# Patient Record
Sex: Male | Born: 1945 | Race: White | Hispanic: No | Marital: Single | Smoking: Former smoker
Health system: Southern US, Community
[De-identification: ages and names within clinical notes are randomized; demographics above are authoritative.]

## PROBLEM LIST (undated history)

## (undated) DIAGNOSIS — I10 Essential (primary) hypertension: Secondary | ICD-10-CM

## (undated) HISTORY — PX: TONSILLECTOMY: SUR1361

---

## 2004-08-23 ENCOUNTER — Emergency Department (HOSPITAL_COMMUNITY): Admission: EM | Admit: 2004-08-23 | Discharge: 2004-08-23 | Payer: Self-pay | Admitting: *Deleted

## 2005-09-11 ENCOUNTER — Ambulatory Visit: Payer: Self-pay | Admitting: Gastroenterology

## 2005-10-14 ENCOUNTER — Ambulatory Visit: Payer: Self-pay | Admitting: Gastroenterology

## 2005-10-14 ENCOUNTER — Encounter (INDEPENDENT_AMBULATORY_CARE_PROVIDER_SITE_OTHER): Payer: Self-pay | Admitting: Specialist

## 2005-10-30 ENCOUNTER — Ambulatory Visit (HOSPITAL_BASED_OUTPATIENT_CLINIC_OR_DEPARTMENT_OTHER): Admission: RE | Admit: 2005-10-30 | Discharge: 2005-10-30 | Payer: Self-pay | Admitting: Emergency Medicine

## 2005-11-03 ENCOUNTER — Ambulatory Visit: Payer: Self-pay | Admitting: Internal Medicine

## 2007-08-17 ENCOUNTER — Encounter: Admission: RE | Admit: 2007-08-17 | Discharge: 2007-08-17 | Payer: Self-pay | Admitting: Internal Medicine

## 2008-07-29 ENCOUNTER — Encounter: Admission: RE | Admit: 2008-07-29 | Discharge: 2008-07-29 | Payer: Self-pay | Admitting: Internal Medicine

## 2010-09-26 ENCOUNTER — Encounter: Payer: Self-pay | Admitting: Gastroenterology

## 2010-10-18 NOTE — Letter (Signed)
Summary: Colonoscopy Letter  [facility]  [address] [place], [place] [address]   Phone: [phone]  Fax: [phone]      September 26, 2010 MRN: [id number]   Anthony Burns [address] [place], [place]  [address]   Dear Mr. RECORD,   According to your medical record, it is time for you to schedule a Colonoscopy. The American Cancer Society recommends this procedure as a method to detect early colon cancer. Patients with a family history of colon cancer, or a personal history of colon polyps or inflammatory bowel disease are at increased risk.  This letter has been generated based on the recommendations made at the time of your procedure. If you feel that in your particular situation this may no longer apply, please contact our office.  Please call our office at [phone] to schedule this appointment or to update your records at your earliest convenience.  Thank you for cooperating with [place] to provide you with the very best care possible.   Sincerely,  Judie Petit T. Russella Dar, M.D.  [facility] [phone]

## 2011-02-01 NOTE — Procedures (Signed)
Anthony Burns, Anthony Burns               ACCOUNT NO.:  [id number]   MEDICAL RECORD NO.:  [id number]          PATIENT TYPE:  OUT   LOCATION:  SLEEP CENTER                 FACILITY:  [facility]   PHYSICIAN:  Clinton D. Maple Hudson, M.D. DATE OF BIRTH:  [birth year]   DATE OF STUDY:                              NOCTURNAL POLYSOMNOGRAM   REFERRING PHYSICIAN:  Dr. Leslee Home.   DATE OF STUDY:  October 30, 2005.   INDICATION FOR STUDY:  Insomnia with sleep apnea.   EPWORTH SLEEPINESS SCORE:  7/24.   BMI:  24.   WEIGHT:  190 pounds.   HOME MEDICATION:  Adderall.   The patient describes a rotating work shift and high stress associated with  his work.   SLEEP ARCHITECTURE:  Total sleep time 324 minutes with sleep efficiency 80%.  Stage I was 2%, stage II 67%, stages III and IV absent, REM 31% of total  sleep time. Sleep latency 78 minutes, REM latency 54 minutes, awake after  sleep onset 6 minutes, arousal index 16.8, indicating mild fragmentation. No  bedtime medication was reported.   RESPIRATORY DATA:  Apnea/hypopnea index (AHI, RDI) 11.6 obstructive events  per hour indicating mild obstructive sleep apnea/hypopnea syndrome. This  included 1 central apnea, 5 obstructive apneas and 57 hypopneas. Events were  not positional. REM AHI 24.2 per hour. Criteria for C-PAP titration by split  protocol on the study night were not met.   OXYGEN DATA:  Moderate snoring with oxygen desaturation to a nadir of 87%.  Mean saturation through the study was 94% on room air.   CARDIAC DATA:  Normal sinus rhythm with occasional PAC.   MOVEMENT SLASH PARASOMNIA:  Occasional leg jerk with insignificant impact on  sleep.   IMPRESSION/RECOMMENDATIONS:  1.  Mild obstructive sleep apnea/hypopnea syndrome, AHI 11.6 per hour with      nonpositional events, moderate snoring and oxygen desaturation to 87%.  2.  Scores in this range are usually treated with C-PAP only if more      conservative measures are unsuccessful.  Note that the patient was fitted      for a mask at the beginning of the study and indicated that he would not      be comfortable wearing it and apparently felt that it would be too      confining of his need to shift position during sleep. If he were to      return for C-PAP titration, I would anticipate he might benefit from a      sleep medication and time to adjust and learn to work with C-PAP if it      were to be a successful therapeutic option.      Clinton D. Maple Hudson, M.D.  Diplomate, Biomedical engineer of Sleep Medicine  Electronically Signed     CDY/MEDQ  D:  11/03/2005 12:01:00  T:  11/03/2005 23:09:01  Job:  [id number]

## 2012-06-01 ENCOUNTER — Encounter: Payer: Self-pay | Admitting: Gastroenterology

## 2012-07-08 ENCOUNTER — Encounter: Payer: Self-pay | Admitting: Gastroenterology

## 2012-07-08 ENCOUNTER — Other Ambulatory Visit: Payer: Self-pay | Admitting: Internal Medicine

## 2012-07-08 DIAGNOSIS — R29890 Loss of height: Secondary | ICD-10-CM

## 2012-07-10 ENCOUNTER — Other Ambulatory Visit: Payer: Self-pay

## 2012-07-20 ENCOUNTER — Ambulatory Visit
Admission: RE | Admit: 2012-07-20 | Discharge: 2012-07-20 | Disposition: A | Discharge status: Home / Self Care | Payer: BC Managed Care – PPO | Source: Ambulatory Visit | Attending: Internal Medicine | Admitting: Internal Medicine

## 2012-07-20 DIAGNOSIS — R29890 Loss of height: Secondary | ICD-10-CM

## 2012-08-07 ENCOUNTER — Ambulatory Visit
Admission: RE | Admit: 2012-08-07 | Discharge: 2012-08-07 | Disposition: A | Discharge status: Home / Self Care | Payer: BC Managed Care – PPO | Source: Ambulatory Visit | Attending: Internal Medicine | Admitting: Internal Medicine

## 2012-08-07 ENCOUNTER — Other Ambulatory Visit: Payer: Self-pay | Admitting: Internal Medicine

## 2012-08-07 DIAGNOSIS — R05 Cough: Secondary | ICD-10-CM

## 2012-08-31 ENCOUNTER — Telehealth: Payer: Self-pay | Admitting: *Deleted

## 2012-08-31 NOTE — Telephone Encounter (Signed)
NO show for previsit. Sent no show letter

## 2012-09-14 ENCOUNTER — Encounter: Payer: Self-pay | Admitting: Gastroenterology

## 2012-09-17 ENCOUNTER — Telehealth: Payer: Self-pay

## 2012-09-21 NOTE — Telephone Encounter (Signed)
No answer

## 2012-10-02 ENCOUNTER — Encounter: Payer: Self-pay | Admitting: Gastroenterology

## 2012-11-13 ENCOUNTER — Telehealth: Payer: Self-pay | Admitting: Gastroenterology

## 2012-11-16 NOTE — Telephone Encounter (Signed)
No answer and no machine

## 2012-11-17 NOTE — Telephone Encounter (Signed)
I have attempted to reach the patient again.  There is no answer at the listed phone number and his work number is a number that is no longer in service.  I will await a return call from the patient

## 2012-12-30 ENCOUNTER — Encounter: Payer: Self-pay | Admitting: Gastroenterology

## 2014-02-03 ENCOUNTER — Ambulatory Visit (INDEPENDENT_AMBULATORY_CARE_PROVIDER_SITE_OTHER): Payer: BC Managed Care – PPO | Admitting: Family Medicine

## 2014-02-03 VITALS — BP 136/86 | HR 83 | Ht 74.0 in | Wt 199.0 lb

## 2014-02-03 DIAGNOSIS — G5700 Lesion of sciatic nerve, unspecified lower limb: Secondary | ICD-10-CM

## 2014-02-03 DIAGNOSIS — S76011A Strain of muscle, fascia and tendon of right hip, initial encounter: Secondary | ICD-10-CM

## 2014-02-03 DIAGNOSIS — IMO0002 Reserved for concepts with insufficient information to code with codable children: Secondary | ICD-10-CM

## 2014-02-03 DIAGNOSIS — G5701 Lesion of sciatic nerve, right lower limb: Secondary | ICD-10-CM

## 2014-02-03 NOTE — Assessment & Plan Note (Signed)
Currently the patient's pain is secondary to a flexor strain. Patient was given home exercise program, discussed anti-inflammatories and over-the-counter medications that could be beneficial as well as an icing program. Patient try these interventions and come back in 3 weeks. Patient has not made any improvement at length ultrasound area as well as get an x-ray patient's hip to make sure that this is not referred pain. Patient is also having piriformis tightness as well.

## 2014-02-03 NOTE — Patient Instructions (Signed)
Very nice to meet you.  Tennis ball in your back right pocket at all times.  Try the exercises most days of the week Ice 20 minutes 2 times a day Turmeric 500mg  twice daily. Heel lift in right shoe at all times.  Ask Dr. Alessandra BevelsVaughn about your iron level, if low or low normal then start 325mg  daily.  Come back in 3 weeks.

## 2014-02-03 NOTE — Progress Notes (Signed)
  Tawana ScaleZach Smith D.O. [facility] [address] [place], [place] [address] Phone: [phone] Subjective:      CC: Hip problem.   BJY:NWGNFAOZHYHPI:Subjective Pricilla LarssonStephen P Stegmaier is a 68 y.o. male coming in with complaint of Hip pain. Patient states that this is his right hip. Patient states it: On for quite a while. Patient contribute the original injury secondary to the same position at work. Patient states that now he has pain any time he ambulates. Patient states it seems to be on the anterior lateral aspect of the hip. Denies any groin pain that is associated with that. Patient has tried icing. Patient is to walk a considerable distance and states that he is unable to walk 100 yards without feeling fatigued. Patient denies any radiation down the leg or any numbness. Patient denies any significant weakness as well. Patient states though that the pain doesn't seem to wake him up at night either. Rates the pain 6/10 in severity.     Past medical history, social, surgical and family history all reviewed in electronic medical record.   Review of Systems: No headache, visual changes, nausea, vomiting, diarrhea, constipation, dizziness, abdominal pain, skin rash, fevers, chills, night sweats, weight loss, swollen lymph nodes, body aches, joint swelling, muscle aches, chest pain, shortness of breath, mood changes.   Objective Blood pressure 136/86, pulse 83, height 6\' 2"  (1.88 m), weight 199 lb (90.266 kg), SpO2 96.00%.  General: No apparent distress alert and oriented x3 mood and affect normal, dressed appropriately.  HEENT: Pupils equal, extraocular movements intact  Respiratory: Patient's speak in full sentences and does not appear short of breath  Cardiovascular: No lower extremity edema, non tender, no erythema  Skin: Warm dry intact with no signs of infection or rash on extremities or on axial skeleton.  Abdomen: Soft nontender  Neuro: Cranial nerves II through XII are intact, neurovascularly  intact in all extremities with 2+ DTRs and 2+ pulses.  Lymph: No lymphadenopathy of posterior or anterior cervical chain or axillae bilaterally.  Gait normal with good balance and coordination.  MSK:  Non tender with full range of motion and good stability and symmetric strength and tone of shoulders, elbows, wrist,  knee and ankles bilaterally.  Hip: ROM IR: 35 Deg, ER: 45 Deg, Flexion: 120 Deg, Extension: 90 Deg, Abduction: 45 Deg, Adduction: 45 Deg Strength IR: 5/5, ER: 5/5, Flexion: 4/5, Extension: 5/5, Abduction: 4/5, Adduction: 5/5 Pelvic alignment unremarkable to inspection and palpation. Standing hip rotation and gait without trendelenburg sign / unsteadiness.  Mild tenderness over piriformis and greater trochanter. No pain with FABER or FADIR. No SI joint tenderness and normal minimal SI movement.      Impression and Recommendations:     This case required medical decision making of moderate complexity.

## 2014-02-03 NOTE — Assessment & Plan Note (Signed)
Compensation secondary to patient's hip flexor strain I think. Piriformis Syndrome  Using an anatomical model, reviewed with the patient the structures involved and how they related to diagnosis. The patient indicated understanding.   The patient was given a handout from Dr. Ailene Ardsouzier's book "The Sports Medicine Patient Advisor" describing the anatomy and rehabilitation of the following condition: Piriformis Syndrome  Also given a handout with more extensive Piriformis stretching, hip flexor and abductor strengthening, ham stretching  Rec deep massage, explained self-massage with ball RTC in 3 weeks.

## 2014-03-03 ENCOUNTER — Other Ambulatory Visit (INDEPENDENT_AMBULATORY_CARE_PROVIDER_SITE_OTHER): Payer: BC Managed Care – PPO

## 2014-03-03 ENCOUNTER — Ambulatory Visit (INDEPENDENT_AMBULATORY_CARE_PROVIDER_SITE_OTHER): Payer: BC Managed Care – PPO | Admitting: Family Medicine

## 2014-03-03 VITALS — BP 140/88 | HR 70 | Ht 74.0 in | Wt 201.0 lb

## 2014-03-03 DIAGNOSIS — M7061 Trochanteric bursitis, right hip: Secondary | ICD-10-CM | POA: Insufficient documentation

## 2014-03-03 DIAGNOSIS — M25559 Pain in unspecified hip: Secondary | ICD-10-CM

## 2014-03-03 DIAGNOSIS — M76899 Other specified enthesopathies of unspecified lower limb, excluding foot: Secondary | ICD-10-CM

## 2014-03-03 DIAGNOSIS — M25551 Pain in right hip: Secondary | ICD-10-CM

## 2014-03-03 NOTE — Progress Notes (Signed)
Anthony ScaleZach Burns D.O. [facility] [address] [place], [place] [address] Phone: [phone](336) [phone] Subjective:      CC: Hip problem follow up.   BJY:NWGNFAOZHYHPI:Subjective Anthony LarssonStephen P Burns is a 68 y.o. male coming in with complaint of Hip pain. Patient was seen previously was diagnosed with appear former syndrome as well as hip flexor strain. Patient states that the hip flexors seems to be better patient states that most of his pain is now localized over the lateral aspect of the hip. Patient is to the piriformis stretches has been helpful and has been doing some deep massage which has been also helpful. Patient though states that now it seems to be mostly on the lateral aspect of the hip and can be very severe. Patient notes is going from a sitting to standing position being the worst. Patient still has been able to walk and able to work but finds it difficult secondary to this pain. Does not seem to be responding to our other modalities.     Past medical history, social, surgical and family history all reviewed in electronic medical record.   Review of Systems: No headache, visual changes, nausea, vomiting, diarrhea, constipation, dizziness, abdominal pain, skin rash, fevers, chills, night sweats, weight loss, swollen lymph nodes, body aches, joint swelling, muscle aches, chest pain, shortness of breath, mood changes.   Objective Blood pressure 140/88, pulse 70, height 6\' 2"  (1.88 m), weight 201 lb (91.173 kg), SpO2 96.00%.  General: No apparent distress alert and oriented x3 mood and affect normal, dressed appropriately.  HEENT: Pupils equal, extraocular movements intact  Respiratory: Patient's speak in full sentences and does not appear short of breath  Cardiovascular: No lower extremity edema, non tender, no erythema  Skin: Warm dry intact with no signs of infection or rash on extremities or on axial skeleton.  Abdomen: Soft nontender  Neuro: Cranial nerves II through XII are intact,  neurovascularly intact in all extremities with 2+ DTRs and 2+ pulses.  Lymph: No lymphadenopathy of posterior or anterior cervical chain or axillae bilaterally.  Gait normal with good balance and coordination.  MSK:  Non tender with full range of motion and good stability and symmetric strength and tone of shoulders, elbows, wrist,  knee and ankles bilaterally.  Hip: ROM IR: 35 Deg, ER: 45 Deg, Flexion: 120 Deg, Extension: 90 Deg, Abduction: 45 Deg, Adduction: 45 Deg Strength IR: 5/5, ER: 5/5, Flexion: 4/5, Extension: 5/5, Abduction: 4/5, Adduction: 5/5 Pelvic alignment unremarkable to inspection and palpation. Standing hip rotation and gait without trendelenburg sign / unsteadiness.  Severe tenderness over the greater trochanteric bursa No pain with FABER or FADIR. No SI joint tenderness and normal minimal SI movement.   Procedure: Real-time Ultrasound Guided Injection of right greater trochanteric bursitis secondary to patient's body habitus Device: GE Logiq E  Ultrasound guided injection is preferred based studies that show increased duration, increased effect, greater accuracy, decreased procedural pain, increased response rate, and decreased cost with ultrasound guided versus blind injection.  Verbal informed consent obtained.  Time-out conducted.  Noted no overlying erythema, induration, or other signs of local infection.  Skin prepped in a sterile fashion.  Local anesthesia: Topical Ethyl chloride.  With sterile technique and under real time ultrasound guidance:  Greater trochanteric area was visualized and patient's bursa was noted. A 22-gauge 3 inch needle was inserted and 4 cc of 0.5% Marcaine and 1 cc of Kenalog 40 mg/dL was injected. Pictures taken Completed without difficulty  Pain immediately resolved suggesting  accurate placement of the medication.  Advised to call if fevers/chills, erythema, induration, drainage, or persistent bleeding.  Images permanently stored and  available for review in the ultrasound unit.  Impression: Technically successful ultrasound guided injection.      Impression and Recommendations:     This case required medical decision making of moderate complexity.

## 2014-03-03 NOTE — Patient Instructions (Signed)
Good to see you One exercises on the wall put heel and butt on wall.  Raise 6-8 inches and hold 2 seconds, down slow for count of 4 seconds 30 reps daily first week, 2 sets daily second week, 3 sets daily thereafter Look at handout for stretches.  Come back in 3 weeks.

## 2014-03-03 NOTE — Assessment & Plan Note (Signed)
Patient had injection today with near complete resolution of pain. We discussed some exercises and was given a handout. Patient to try these interventions as well as continue the icing and the over-the-counter medications we suggested previously and come back and see us again in 3-4 weeks.  Spent greater than 25 minutes with patient face-to-face and had greater than 50% of counseling including as described above in assessment and plan.

## 2014-03-31 ENCOUNTER — Ambulatory Visit (INDEPENDENT_AMBULATORY_CARE_PROVIDER_SITE_OTHER)
Admission: RE | Admit: 2014-03-31 | Discharge: 2014-03-31 | Disposition: A | Discharge status: Home / Self Care | Payer: BC Managed Care – PPO | Source: Ambulatory Visit | Attending: Family Medicine | Admitting: Family Medicine

## 2014-03-31 ENCOUNTER — Ambulatory Visit (INDEPENDENT_AMBULATORY_CARE_PROVIDER_SITE_OTHER): Payer: BC Managed Care – PPO | Admitting: Family Medicine

## 2014-03-31 VITALS — BP 120/80 | HR 72 | Temp 97.5°F | Ht 74.0 in | Wt 203.0 lb

## 2014-03-31 DIAGNOSIS — M7061 Trochanteric bursitis, right hip: Secondary | ICD-10-CM

## 2014-03-31 DIAGNOSIS — M25559 Pain in unspecified hip: Secondary | ICD-10-CM

## 2014-03-31 DIAGNOSIS — M76899 Other specified enthesopathies of unspecified lower limb, excluding foot: Secondary | ICD-10-CM

## 2014-03-31 DIAGNOSIS — M25551 Pain in right hip: Secondary | ICD-10-CM

## 2014-03-31 MED ORDER — DICLOFENAC SODIUM 2 % TD SOLN: 2.0000 "application " | Freq: Two times a day (BID) | TRANSDERMAL | Status: DC

## 2014-03-31 NOTE — Progress Notes (Signed)
  Anthony ScaleZach Burns D.O. [facility] [address] [place], [place] [address] Phone: [phone] Subjective:      CC: Hip problem follow up.   BJY:NWGNFAOZHYHPI:Subjective Anthony LarssonStephen P Burns is a 68 y.o. male coming in with complaint of Hip pain. Patient does have a hip flexor strain as well as greater trochanteric bursitis. Patient did have an injection at last visit and states that since then he has made significant improvement in pain. Patient states he is about 50-60% better. Patient is able to do much more with ambulation. Still having trouble when he puts weight on one leg such as going up stairs. Patient has been walking with a crutch to be able to do this. Otherwise he has no pain. Patient states when going from a seated to standing position it does take some time for the relaxation occurs. Still denies any groin pain and states that all the pain seems to be mostly in the posterior and lateral aspects of the leg.     Past medical history, social, surgical and family history all reviewed in electronic medical record.   Review of Systems: No headache, visual changes, nausea, vomiting, diarrhea, constipation, dizziness, abdominal pain, skin rash, fevers, chills, night sweats, weight loss, swollen lymph nodes, body aches, joint swelling, muscle aches, chest pain, shortness of breath, mood changes.   Objective Blood pressure 120/80, pulse 72, temperature 97.5 F (36.4 C), temperature source Oral, height 6\' 2"  (1.88 m), weight 203 lb (92.08 kg), SpO2 95.00%.  General: No apparent distress alert and oriented x3 mood and affect normal, dressed appropriately.  HEENT: Pupils equal, extraocular movements intact  Respiratory: Patient's speak in full sentences and does not appear short of breath  Cardiovascular: No lower extremity edema, non tender, no erythema  Skin: Warm dry intact with no signs of infection or rash on extremities or on axial skeleton.  Abdomen: Soft nontender  Neuro: Cranial nerves II  through XII are intact, neurovascularly intact in all extremities with 2+ DTRs and 2+ pulses.  Lymph: No lymphadenopathy of posterior or anterior cervical chain or axillae bilaterally.  Gait normal with good balance and coordination.  MSK:  Non tender with full range of motion and good stability and symmetric strength and tone of shoulders, elbows, wrist,  knee and ankles bilaterally.  Hip: right ROM IR: 35 Deg, ER: 45 Deg, Flexion: 120 Deg, Extension: 90 Deg, Abduction: 45 Deg, Adduction: 45 Deg Strength IR: 5/5, ER: 5/5, Flexion: 4/5, Extension: 5/5, Abduction: 4/5, Adduction: 5/5 Pelvic alignment unremarkable to inspection and palpation. Standing hip rotation and gait without trendelenburg sign / unsteadiness.  Still minimally tender over the lateral aspect of the hip. No pain with FABER or FADIR. No SI joint tenderness and normal minimal SI movement.       Impression and Recommendations:     This case required medical decision making of moderate complexity.

## 2014-03-31 NOTE — Assessment & Plan Note (Signed)
Patient is making significant strides. We discussed the possibility of formal physical therapy which she declined. We discussed the possibility of topical anti-inflammatories and patient was given a trial size. Patient was also given a prescription. We discussed icing protocol and continuing home exercises. I think there is a concern than patient has more of a hip flexor strain. I would like to get x-rays to rule out any intra-articular pathology. Patient will follow up with me again in 3-4 weeks for further evaluation and treatment.  Spent greater than 25 minutes with patient face-to-face and had greater than 50% of counseling including as described above in assessment and plan.

## 2014-03-31 NOTE — Patient Instructions (Signed)
You are doing good.  Ice is still your friend.  Try exercises still 3 times a week.  Try the pennsaid 2 times daily.  Xrays downstairs and no news is good news Come back in 3 weeks.

## 2014-03-31 NOTE — Progress Notes (Signed)
Pre visit review using our clinic review tool, if applicable. No additional management support is needed unless otherwise documented below in the visit note. 

## 2014-04-28 ENCOUNTER — Ambulatory Visit: Payer: BC Managed Care – PPO | Admitting: Family Medicine

## 2014-05-02 ENCOUNTER — Encounter: Payer: Self-pay | Admitting: Family Medicine

## 2014-05-02 ENCOUNTER — Ambulatory Visit (INDEPENDENT_AMBULATORY_CARE_PROVIDER_SITE_OTHER): Payer: BC Managed Care – PPO | Admitting: Family Medicine

## 2014-05-02 ENCOUNTER — Ambulatory Visit (INDEPENDENT_AMBULATORY_CARE_PROVIDER_SITE_OTHER)
Admission: RE | Admit: 2014-05-02 | Discharge: 2014-05-02 | Disposition: A | Discharge status: Home / Self Care | Payer: BC Managed Care – PPO | Source: Ambulatory Visit | Attending: Family Medicine | Admitting: Family Medicine

## 2014-05-02 ENCOUNTER — Encounter: Payer: Self-pay | Admitting: *Deleted

## 2014-05-02 VITALS — BP 142/80 | HR 64 | Ht 74.0 in | Wt 204.0 lb

## 2014-05-02 DIAGNOSIS — M999 Biomechanical lesion, unspecified: Secondary | ICD-10-CM

## 2014-05-02 DIAGNOSIS — G5701 Lesion of sciatic nerve, right lower limb: Secondary | ICD-10-CM

## 2014-05-02 DIAGNOSIS — M9981 Other biomechanical lesions of cervical region: Secondary | ICD-10-CM

## 2014-05-02 DIAGNOSIS — M542 Cervicalgia: Secondary | ICD-10-CM

## 2014-05-02 DIAGNOSIS — G5700 Lesion of sciatic nerve, unspecified lower limb: Secondary | ICD-10-CM

## 2014-05-02 NOTE — Assessment & Plan Note (Signed)
Patient overall does have some mild neck discomfort but does respond fairly well to osteopathic manipulation. We discussed the proper working position keeping his head in a neutral position that could be helpful. Home exercises given 3 times a week I think will be good. We discussed also postural exercises that I think would be beneficial. Patient will try these in her actions as well as continuing the over-the-counter medications he was taking previously and followup again in 3-4 weeks for further evaluation and treatment.  Spent greater than 25 minutes with patient face-to-face and had greater than 50% of counseling including as described above in assessment and plan.

## 2014-05-02 NOTE — Progress Notes (Signed)
Tawana ScaleZach Draco Burns D.O. [facility] [address] [place], [place] [address] Phone: [phone](336) [phone] Subjective:      CC: Hip problem follow up. New pain neck  MWU:XLKGMWNUUVHPI:Subjective Anthony LarssonStephen P Durante is a 68 y.o. male coming in with complaint of Hip pain. Patient does have a hip flexor strain as well as greater trochanteric bursitis. Patient states that approximately 2 weeks ago he woke up one morning the pain was completely gone. Patient states that he's been able to do different activities and has been feeling better. Patient continues to do the exercises as well as stretching as well as icing when needed. Patient is taking albeit over-the-counter medications was discussed previously. Patient is very happy with the results.  Patient was unfortunately having a new problem. Patient has been seen before for some neck discomfort but states that it seems to be worsening. Patient claims the problem on his working position which has changed recently. Patient states that he has to look down on his computer screen and this causes discomfort at the end of the day. Patient is concerned he may have some arthritis that has been exacerbated. Denies any radiation down the arms or any numbness but significant stiffness. Patient has responded well to osteopathic manipulation previously. Patient describes it is more of a dull aching sensation that can keep him up at night. Not responding to over-the-counter medicines really well.    Past medical history, social, surgical and family history all reviewed in electronic medical record.   Review of Systems: No headache, visual changes, nausea, vomiting, diarrhea, constipation, dizziness, abdominal pain, skin rash, fevers, chills, night sweats, weight loss, swollen lymph nodes, body aches, joint swelling, muscle aches, chest pain, shortness of breath, mood changes.   Objective Blood pressure 142/80, pulse 64, height 6\' 2"  (1.88 m), weight 204 lb (92.534 kg), SpO2 95.00%.    General: No apparent distress alert and oriented x3 mood and affect normal, dressed appropriately.  HEENT: Pupils equal, extraocular movements intact  Respiratory: Patient's speak in full sentences and does not appear short of breath  Cardiovascular: No lower extremity edema, non tender, no erythema  Skin: Warm dry intact with no signs of infection or rash on extremities or on axial skeleton.  Abdomen: Soft nontender  Neuro: Cranial nerves II through XII are intact, neurovascularly intact in all extremities with 2+ DTRs and 2+ pulses.  Lymph: No lymphadenopathy of posterior or anterior cervical chain or axillae bilaterally.  Gait normal with good balance and coordination.  MSK:  Non tender with full range of motion and good stability and symmetric strength and tone of shoulders, elbows, wrist,  knee and ankles bilaterally.  Hip: right ROM IR: 35 Deg, ER: 45 Deg, Flexion: 120 Deg, Extension: 90 Deg, Abduction: 45 Deg, Adduction: 45 Deg Strength IR: 5/5, ER: 5/5, Flexion: 4/5, Extension: 5/5, Abduction: 5/5, Adduction: 5/5 Pelvic alignment unremarkable to inspection and palpation. Standing hip rotation and gait without trendelenburg sign / unsteadiness. Nontender on the lateral aspect of the No pain with FABER or FADIR. No SI joint tenderness and normal minimal SI movement.  Neck: Inspection unremarkable. No palpable stepoffs. Negative Spurling's maneuver. Mild decrease the last 10 of extension as well as 10 of right-sided rotation Grip strength and sensation normal in bilateral hands Strength good C4 to T1 distribution No sensory change to C4 to T1 Negative Hoffman sign bilaterally Reflexes normal   OMT Physical Exam   Standing flexion right  Seated Flexion right  Cervical  C3 flexed rotated and  side bent right C7 flexed rotated and side bent left  Thoracic T3 extended rotated and side bent right  T5 extended rotated and side bent left  Lumbar L2 flexed rotated and  side bent right  Sacrum Left on left Illium        Impression and Recommendations:     This case required medical decision making of moderate complexity.

## 2014-05-02 NOTE — Assessment & Plan Note (Signed)
Decision today to treat with OMT was based on Physical Exam  After verbal consent patient was treated with muscle energy, FPR techniques in cervical, thoracic and lumbar areas  Patient tolerated the procedure well with improvement in symptoms  Patient given exercises, stretches and lifestyle modifications  See medications in patient instructions if given  Patient will follow up in 3-4 weeks

## 2014-05-02 NOTE — Patient Instructions (Signed)
Good to see you as always.  Hope you got your shopping done that day :) Ice is your friend For your neck try the exercises 3 times a week Exercises on wall.  Heels, butt shoulders and head touching goal of 5 minutes daily Monitor at eye level and keyboard should be at 90 degrees.  Vitamin D 2000 IU dialy Turmeric 500mg  twice daily  Glucosamine 1500mg  daily Come back in 4 weeks.

## 2014-05-02 NOTE — Assessment & Plan Note (Signed)
Patient seems to have had pain resolved at this time. Encourage him to continue the exercises on a regular basis. Patient will continue to work on hip abductor strengthening. Patient will come back and see me again in 3-4 weeks for further evaluation and treatment.

## 2015-05-01 ENCOUNTER — Encounter: Payer: Self-pay | Admitting: Family Medicine

## 2015-05-01 ENCOUNTER — Other Ambulatory Visit (INDEPENDENT_AMBULATORY_CARE_PROVIDER_SITE_OTHER): Payer: BC Managed Care – PPO

## 2015-05-01 ENCOUNTER — Ambulatory Visit (INDEPENDENT_AMBULATORY_CARE_PROVIDER_SITE_OTHER): Payer: BC Managed Care – PPO | Admitting: Family Medicine

## 2015-05-01 VITALS — BP 130/80 | HR 64 | Wt 211.0 lb

## 2015-05-01 DIAGNOSIS — M25562 Pain in left knee: Secondary | ICD-10-CM

## 2015-05-01 DIAGNOSIS — S83419A Sprain of medial collateral ligament of unspecified knee, initial encounter: Secondary | ICD-10-CM | POA: Insufficient documentation

## 2015-05-01 DIAGNOSIS — S83412A Sprain of medial collateral ligament of left knee, initial encounter: Secondary | ICD-10-CM | POA: Diagnosis not present

## 2015-05-01 NOTE — Progress Notes (Signed)
Pre visit review using our clinic review tool, if applicable. No additional management support is needed unless otherwise documented below in the visit note. 

## 2015-05-01 NOTE — Progress Notes (Signed)
[name] [address] [place], [place] [address] Phone: [phone] Subjective:    CC: Left knee pain  Anthony Burns is a 69 y.o. male coming in with complaint of left knee pain. Patient states that this left knee pain has been there intermittently for years but has significantly gotten worse over the course last 4-6 weeks. Patient describes the pain as more of a dull throbbing aching sensation on the medial aspect. Patient states that there is some instability and the mornings. Patient though states that throughout the day it seems to loosen up and feel little bit better. Patient states though that it can wake him up at night. Rates the severity of 5 out of 10. Has only been using some topical over-the-counter medications when it does hurt and it does respond to this. Denies any radiation down the leg, any locking or any numbness.     Past medical history, social, surgical and family history all reviewed in electronic medical record.   Review of Systems: No headache, visual changes, nausea, vomiting, diarrhea, constipation, dizziness, abdominal pain, skin rash, fevers, chills, night sweats, weight loss, swollen lymph nodes, body aches, joint swelling, muscle aches, chest pain, shortness of breath, mood changes.   Objective Blood pressure 130/80, pulse 64, weight 211 lb (95.709 kg), SpO2 96 %.  General: No apparent distress alert and oriented x3 mood and affect normal, dressed appropriately.  HEENT: Pupils equal, extraocular movements intact  Respiratory: Patient's speak in full sentences and does not appear short of breath  Cardiovascular: No lower extremity edema, non tender, no erythema  Skin: Warm dry intact with no signs of infection or rash on extremities or on axial skeleton.  Abdomen: Soft nontender  Neuro: Cranial nerves II through XII are intact, neurovascularly intact in all extremities with 2+ DTRs and 2+ pulses.  Lymph: No  lymphadenopathy of posterior or anterior cervical chain or axillae bilaterally.  Gait normal with good balance and coordination.  MSK:  Non tender with full range of motion and good stability and symmetric strength and tone of shoulders, elbows, wrist, hip and ankles bilaterally.  Knee: Left Normal to inspection with no erythema or effusion or obvious bony abnormalities. Tender to palpation over the medial joint line ROM full in flexion and extension and lower leg rotation. Ligaments with solid consistent endpoints including ACL, PCL, LCL, MCL. Discomfort with stressing the MCL Negative Mcmurray's, Apley's, and Thessalonian tests. Non painful patellar compression. Patellar glide without crepitus. Patellar and quadriceps tendons unremarkable. Hamstring and quadriceps strength is normal.    MSK US performed of: Left knee This study was ordered, performed, and interpreted by Terrilee Files D.O.  Knee: All structures visualized. Anteromedial, anterolateral, posteromedial, and posterolateral menisci unremarkable without tearing, fraying, effusion, or displacement. Minimal osteophytic changes noted. Patellar Tendon unremarkable on long and transverse views without effusion. No abnormality of prepatellar bursa. Patient does have significant hypoechoic changes at the insertion of the MCL on the tibia. No bony abnormality or avulsion noted. Increasing Doppler flow noted.  IMPRESSION:  MCL sprain  Procedure note 97110; 15 minutes spent for Therapeutic exercises as stated in above notes.  This included exercises focusing on stretching, strengthening, with significant focus on eccentric aspects.  Flexion and extension focusing on the vastus medialis oblique muscle as well as hip abductor strengthening. Handout given. Proper technique shown and discussed handout in great detail with ATC.  All questions were discussed and answered.  Impression and Recommendations:     This case required  medical decision making of moderate complexity.

## 2015-05-01 NOTE — Patient Instructions (Signed)
Good to see you and congrats on retirement.  Ice 20 minutes 2 times daily. Usually after activity and before bed. Exercises 3 times a week.  Tyr brace with activity pennsaid pinkie amount topically 2 times daily as needed.  Turmeric  twice daily See me again in 3-4 weeks.

## 2015-05-01 NOTE — Assessment & Plan Note (Signed)
Patient's exam is very nonspecific today. Patient on ultrasound only had some mild hypoechoic changes surrounding the MCL. Patient is tender in this area. Patient will be treated for an MCL sprain. Work with Event organiser to learn home exercises and icing protocol. Patient try topical anti-inflammatory signs needed. We discussed what activities to avoid as well as possible bracing. Patient come back and see me again in 3-4 weeks. If continuing have pain I would consider injection as well as x-rays. We will discuss at follow-up.

## 2015-05-26 ENCOUNTER — Other Ambulatory Visit (INDEPENDENT_AMBULATORY_CARE_PROVIDER_SITE_OTHER): Payer: BC Managed Care – PPO

## 2015-05-26 ENCOUNTER — Encounter: Payer: Self-pay | Admitting: Family Medicine

## 2015-05-26 ENCOUNTER — Ambulatory Visit (INDEPENDENT_AMBULATORY_CARE_PROVIDER_SITE_OTHER): Payer: BC Managed Care – PPO | Admitting: Family Medicine

## 2015-05-26 VITALS — BP 142/80 | HR 60 | Wt 209.0 lb

## 2015-05-26 DIAGNOSIS — S83412A Sprain of medial collateral ligament of left knee, initial encounter: Secondary | ICD-10-CM | POA: Diagnosis not present

## 2015-05-26 DIAGNOSIS — M25562 Pain in left knee: Secondary | ICD-10-CM

## 2015-05-26 NOTE — Progress Notes (Signed)
  [name] [address] [place], [place] [address] Phone: [phone] Subjective:    CC: Left knee pain  BJY:NWGNFAOZHY Anthony Burns is a 69 y.o. male coming in with complaint of left knee pain. Patient was found to have an MCL strain with question will meniscal injury. Patient's elected try conservative therapy with topical anti-implant was, icing, and bracing. Patient states that he is feeling significantly better. States that he is a proximal wing 90% better. States that if he walks a long amount of time he has some difficulty but the brace seems to be very helpful. Patient denies any locking on him or giving out on him. States that the instability that he is feeling previously is significantly better as well. No new symptoms.     Past medical history, social, surgical and family history all reviewed in electronic medical record.   Review of Systems: No headache, visual changes, nausea, vomiting, diarrhea, constipation, dizziness, abdominal pain, skin rash, fevers, chills, night sweats, weight loss, swollen lymph nodes, body aches, joint swelling, muscle aches, chest pain, shortness of breath, mood changes.   Objective Blood pressure 142/80, pulse 60, weight 209 lb (94.802 kg), SpO2 97 %.  General: No apparent distress alert and oriented x3 mood and affect normal, dressed appropriately.  HEENT: Pupils equal, extraocular movements intact  Respiratory: Patient's speak in full sentences and does not appear short of breath  Cardiovascular: No lower extremity edema, non tender, no erythema  Skin: Warm dry intact with no signs of infection or rash on extremities or on axial skeleton.  Abdomen: Soft nontender  Neuro: Cranial nerves II through XII are intact, neurovascularly intact in all extremities with 2+ DTRs and 2+ pulses.  Lymph: No lymphadenopathy of posterior or anterior cervical chain or axillae bilaterally.  Gait normal with good balance and  coordination.  MSK:  Non tender with full range of motion and good stability and symmetric strength and tone of shoulders, elbows, wrist, hip and ankles bilaterally.  Knee: Left Normal to inspection with no erythema or effusion or obvious bony abnormalities. Tender to palpation over the medial joint line ROM full in flexion and extension and lower leg rotation. Ligaments with solid consistent endpoints including ACL, PCL, LCL, MCL. No discomfort when stressing the MCL Negative Mcmurray's, Apley's, and Thessalonian tests. Non painful patellar compression. Patellar glide without crepitus. Patellar and quadriceps tendons unremarkable. Hamstring and quadriceps strength is normal.    MSK US performed of: Left knee This study was ordered, performed, and interpreted by Terrilee Files D.O.  Knee: All structures visualized. Anteromedial, anterolateral, posteromedial, and posterolateral menisci unremarkable without tearing, fraying, effusion, very mild subluxation. Minimal osteophytic changes noted. Patellar Tendon unremarkable on long and transverse views without effusion. No abnormality of prepatellar bursa. MCL seems to be completely resolved with very minimal hypoechoic changes on the articular side proximally.  IMPRESSION:  MCL sprain well-healed        Impression and Recommendations:     This case required medical decision making of moderate complexity.

## 2015-05-26 NOTE — Patient Instructions (Signed)
You are doing everything right Ice still after activity Wear brace less and less with daily activities but still good for long walks.  Increase activity as tolerated.  Should be pain free in 4-6 weeks if not see me again.

## 2015-05-26 NOTE — Assessment & Plan Note (Signed)
Patient is doing much better at this time. There is still a concern for potential meniscal injury. We discussed the possibility of an intra-articular injection which patient declined. Do not feel that further imaging is necessary with patient not having any instability at this time. Would like to see patient though improve his endurance with walking and exercises. Encourage him to do more strengthening. Patient will come back again in 6 weeks if pain is not completely resolved.  Spent  25 minutes with patient face-to-face and had greater than 50% of counseling including as described above in assessment and plan.

## 2016-07-24 ENCOUNTER — Other Ambulatory Visit: Payer: Self-pay | Admitting: Internal Medicine

## 2016-07-24 ENCOUNTER — Ambulatory Visit
Admission: RE | Admit: 2016-07-24 | Discharge: 2016-07-24 | Disposition: A | Discharge status: Home / Self Care | Payer: BC Managed Care – PPO | Source: Ambulatory Visit | Attending: Internal Medicine | Admitting: Internal Medicine

## 2016-07-24 DIAGNOSIS — M25562 Pain in left knee: Principal | ICD-10-CM

## 2016-07-24 DIAGNOSIS — M25561 Pain in right knee: Secondary | ICD-10-CM

## 2016-08-18 NOTE — Progress Notes (Signed)
[facility] Gianfranco Araki D.O[facility] [address] [place], [place] [address] Phone: [phone] Subjective:    CC: Left knee pain Follow-up  QMV:[id number]:Subjective  Anthony Burns is a 70 y.o. male coming in with complaint of left knee pain. Patient was last seen greater than one year ago. Patient was then that time have an MCL sprain as well as potential meniscal injury. Patient states Pain now seems to be bilateral. Worse with certain activities. Patient states that going up and down stairs or if he tries to kneel when he starts having increasing pain. Has noticed with the right knee has had started some mild limitation in range of motion. States that the pain can be on the front in the back of the knee. Does not remember any true injury.   patient did have x-rays done. Patient did have x-rays taken 07/24/2016 no independent living visualized by me showing slight narrowing of the medial joint space bilaterally with some calcific changes of the patellar tendon. Small effusions bilaterally.  No past medical history on file.  Patient Active Problem List   Diagnosis Date Noted  . Knee MCL sprain 05/01/2015  . Neck pain 05/02/2014  . Nonallopathic lesion of cervical region 05/02/2014  . Greater trochanteric bursitis of right hip 03/03/2014  . Strain of flexor muscle of right hip 02/03/2014  . Piriformis syndrome of right side 02/03/2014    No past surgical history on file. Social History   Social History  . Marital status: Single    Spouse name: N/A  . Number of children: N/A  . Years of education: N/A   Social History Main Topics  . Smoking status: Former Games developermoker  . Smokeless tobacco: None  . Alcohol use None  . Drug use: Unknown  . Sexual activity: Not Asked   Other Topics Concern  . None   Social History Narrative  . None   Not on File No family history on file. No family history rheumatological diseases.  Past medical history, social, surgical and family history all  reviewed in electronic medical record.  No pertanent information unless stated regarding to the chief complaint.   Review of Systems:Review of systems updated and as accurate as of 08/19/16  No headache, visual changes, nausea, vomiting, diarrhea, constipation, dizziness, abdominal pain, skin rash, fevers, chills, night sweats, weight loss, swollen lymph nodes, body aches, joint swelling, muscle aches, chest pain, shortness of breath, mood changes.   Objective  Blood pressure 138/80, pulse 69, height 6\' 2"  (1.88 m), weight 216 lb (98 kg), SpO2 95 %. Systems examined below as of 08/19/16   General: No apparent distress alert and oriented x3 mood and affect normal, dressed appropriately.  HEENT: Pupils equal, extraocular movements intact  Respiratory: Patient's speak in full sentences and does not appear short of breath  Cardiovascular: No lower extremity edema, non tender, no erythema  Skin: Warm dry intact with no signs of infection or rash on extremities or on axial skeleton.  Abdomen: Soft nontender  Neuro: Cranial nerves II through XII are intact, neurovascularly intact in all extremities with 2+ DTRs and 2+ pulses.  Lymph: No lymphadenopathy of posterior or anterior cervical chain or axillae bilaterally.  Gait normal with good balance and coordination.  MSK:  Non tender with full range of motion and good stability and symmetric strength and tone of shoulders, elbows, wrist, hip, and ankles bilaterally. Mild arthritic changes of multiple joints   Knee: Bilateral Normal to inspection with no erythema or effusion  or obvious bony abnormalities. Mild tenderness over the patella area bilaterally right greater left Lacks last 5 of flexion bilaterally Ligaments with solid consistent endpoints including ACL, PCL, LCL, MCL. Negative Mcmurray's, Apley's, and Thessalonian tests. painful patellar compression. Patellar glide with mild crepitus. Right greater left Patellar and quadriceps tendons  unremarkable. Hamstring and quadriceps strength is normal.   MSK US performed of: Bilateral This study was ordered, performed, and interpreted by Terrilee FilesZach Eli Pattillo D.O.  Knee: All structures visualized. Anteromedial, anterolateral, posteromedial, and posterolateral menisci unremarkable without tearing, fraying, effusion, or displacement. Mild narrowing noted of the medial joint line bilaterally Patellar Tendon unremarkable on long and transverse views without effusion. Calcific changes of the quadricep tendon noted. No abnormality of prepatellar bursa. LCL and MCL unremarkable on long and transverse views. No abnormality of origin of medial or lateral head of the gastrocnemius.  IMPRESSION:  Calcific quadricep tendinitis    Impression and Recommendations:     This case required medical decision making of moderate complexity.      Note: This dictation was prepared with Dragon dictation along with smaller phrase technology. Any transcriptional errors that result from this process are unintentional.

## 2016-08-19 ENCOUNTER — Encounter: Payer: Self-pay | Admitting: Family Medicine

## 2016-08-19 ENCOUNTER — Ambulatory Visit (INDEPENDENT_AMBULATORY_CARE_PROVIDER_SITE_OTHER): Payer: BC Managed Care – PPO | Admitting: Family Medicine

## 2016-08-19 DIAGNOSIS — M76899 Other specified enthesopathies of unspecified lower limb, excluding foot: Secondary | ICD-10-CM | POA: Insufficient documentation

## 2016-08-19 DIAGNOSIS — M170 Bilateral primary osteoarthritis of knee: Secondary | ICD-10-CM | POA: Diagnosis not present

## 2016-08-19 NOTE — Assessment & Plan Note (Signed)
Patient is more of a quadricep tendinitis. Work with Event organiserathletic trainer today to learn home exercises in greater detail. We discussed the importance of vitamin D with patient having calcific deposits. Discussed which x-rays to avoid, compression sleeves, do not believe that he needs a custom brace at this time. Some of the underlying arthritic changes could also be contribute in. Patient will come back and see me again in 4 weeks. If worsening pain then consider injection.

## 2016-08-19 NOTE — Patient Instructions (Addendum)
Good to see you  I do believe most of this is from quadricep tendonitis. Calcium is there as well.  Lets try a patella strap but wear it above the knee cap with activity  Ice 20 minutes 2 times daily. Usually after activity and before bed. Melxoicam daily for 10 days then as needed.  Avoid deep squats if you can  Biking or elliptical may be better for activity  Exercises 3 times a week.  See me again I n4-6 weeks if not better

## 2016-09-14 NOTE — Progress Notes (Signed)
Anthony Burns D.O. [facility] [address] [place], [place] [address] Phone: [phone] Subjective:    CC: Left knee pain Follow-up  BJY:NWGNFAOZHYHPI:Subjective  Anthony Burns is a 70 y.o. male coming in with complaint of left knee pain. Patient was seen one month ago and did have more of a quadricep tendinitis. Patient was having some tightness in the back of the knee that was likely secondary to hamstring tightness. Patient was to start doing some exercises. Patient states any has not had any significant improvement and continues to have swelling of the knee. Patient states though that the right side seems to be worse than left. Patient states on the [place] is being on the anterior aspect of the knee. Seems to be very localized on the right knee at this time.   patient did have x-rays done. Patient did have x-rays taken 07/24/2016 no independent living visualized by me showing slight narrowing of the medial joint space bilaterally with some calcific changes of the patellar tendon. Small effusions bilaterally.  No past medical history on file.  Patient Active Problem List   Diagnosis Date Noted  . Quadriceps tendinitis 08/19/2016  . Degenerative arthritis of knee, bilateral 08/19/2016  . Knee MCL sprain 05/01/2015  . Neck pain 05/02/2014  . Nonallopathic lesion of cervical region 05/02/2014  . Greater trochanteric bursitis of right hip 03/03/2014  . Strain of flexor muscle of right hip 02/03/2014  . Piriformis syndrome of right side 02/03/2014    No past surgical history on file. Social History   Social History  . Marital status: Single    Spouse name: N/A  . Number of children: N/A  . Years of education: N/A   Social History Main Topics  . Smoking status: Former Games developermoker  . Smokeless tobacco: None  . Alcohol use None  . Drug use: Unknown  . Sexual activity: Not Asked   Other Topics Concern  . None   Social History Narrative  . None   Not on File No family history on  file. No family history rheumatological diseases.  Past medical history, social, surgical and family history all reviewed in electronic medical record.  No pertanent information unless stated regarding to the chief complaint.   Review of Systems: No headache, visual changes, nausea, vomiting, diarrhea, constipation, dizziness, abdominal pain, skin rash, fevers, chills, night sweats, weight loss, swollen lymph nodes, body aches, joint swelling, muscle aches, chest pain, shortness of breath, mood changes.     Objective  Blood pressure 140/80, pulse 74, height 6\' 2"  (1.88 m), weight 221 lb (100.2 kg), SpO2 98 %.   Systems examined below as of 09/17/16 General: NAD A&O x3 mood, affect normal  HEENT: Pupils equal, extraocular movements intact no nystagmus Respiratory: not short of breath at rest or with speaking Cardiovascular: No lower extremity edema, non tender Skin: Warm dry intact with no signs of infection or rash on extremities or on axial skeleton. Abdomen: Soft nontender, no masses Neuro: Cranial nerves  intact, neurovascularly intact in all extremities with 2+ DTRs and 2+ pulses. Lymph: No lymphadenopathy appreciated today  Gait normal with good balance and coordination.  MSK:  Non tender with full range of motion and good stability and symmetric strength and tone of shoulders, elbows, wrist, hip, and ankles bilaterally. Mild arthritic changes of multiple joints   Knee: Bilateral Normal to inspection with no erythema or effusion or obvious bony abnormalities. Nontender on exam Lacks last 5 of flexion right side only right-sided Ligaments  with solid consistent endpoints including ACL, PCL, LCL, MCL. Negative Mcmurray's, Apley's, and Thessalonian tests. painful patellar compression. Patellar glide with mild crepitus. Right greater left Patellar and quadriceps tendons unremarkable. Hamstring and quadriceps strength is normal.   MSK US performed of: Bilateral This study was  ordered, performed, and interpreted by Anthony Burns D.O.  Knee: Patient's knee cyst noted under the VMO.  Compressible.   IMPRESSION:  Right infra-quadricep cyst  Procedure: Real-time Ultrasound Guided Injection of right knee Device: GE Logiq E  Ultrasound guided injection is preferred based studies that show increased duration, increased effect, greater accuracy, decreased procedural pain, increased response rate, and decreased cost with ultrasound guided versus blind injection.  Verbal informed consent obtained.  Time-out conducted.  Noted no overlying erythema, induration, or other signs of local infection.  Skin prepped in a sterile fashion.  Local anesthesia: Topical Ethyl chloride.  With sterile technique and under real time ultrasound guidance: with a 21g 2 inch needle injected with 2 cc of 0.5% marcaine. Then aspirated 1 mL gel-like fluid.then injected 0.5 cc of 40mg /dL kenalog.  Pain immediately resolved suggesting accurate placement of the medication.  Advised to call if fevers/chills, erythema, induration, drainage, or persistent bleeding.  Images permanently stored and available for review in the ultrasound unit.  Impression: Technically successful ultrasound guided injection.     Impression and Recommendations:     This case required medical decision making of moderate complexity.      Note: This dictation was prepared with Dragon dictation along with smaller phrase technology. Any transcriptional errors that result from this process are unintentional.

## 2016-09-17 ENCOUNTER — Encounter: Payer: Self-pay | Admitting: Family Medicine

## 2016-09-17 ENCOUNTER — Ambulatory Visit: Payer: Self-pay

## 2016-09-17 ENCOUNTER — Ambulatory Visit (INDEPENDENT_AMBULATORY_CARE_PROVIDER_SITE_OTHER): Payer: BC Managed Care – PPO | Admitting: Family Medicine

## 2016-09-17 VITALS — BP 140/80 | HR 74 | Ht 74.0 in | Wt 221.0 lb

## 2016-09-17 DIAGNOSIS — M76899 Other specified enthesopathies of unspecified lower limb, excluding foot: Secondary | ICD-10-CM | POA: Diagnosis not present

## 2016-09-17 DIAGNOSIS — M238X1 Other internal derangements of right knee: Secondary | ICD-10-CM | POA: Diagnosis not present

## 2016-09-17 NOTE — Patient Instructions (Addendum)
Happy New Year! Stay active We drained the cyst today and I hope it helps Otherwise the knee looks good.   OK for you to start some more walking if you would like.  I would continue my exercises 2-3 times a week and then would consider going to the gym 3-4 times a week.

## 2016-09-17 NOTE — Assessment & Plan Note (Signed)
Patient did have aspiration today. Tolerated the procedure well. We discussed icing regimen. Patient will watch range of motion. Does have some mild quadricep tendinitis still remaining. We'll increase activity slowly. Patient will follow-up again of worsening symptoms. Full range of motion at the end.

## 2017-01-21 NOTE — Progress Notes (Signed)
[name] [address] [place], [place] [address] Phone: [phone] Subjective:    CC: right knee pain Follow-up  [id number]  Anthony Burns is a 71 y.o. male coming in with complaint of right knee pain she was having pain overall. PT DESCRIBES PAIN AS A DULL ACHING PAIN  Started 2 weeks ago and now slowly getting better No locking, but only 50% better at this time.  Worse with walking.  Still has discomfort. Still does not feel quite right. Been able to do all activities of daily living.   patient did have x-rays done. Patient did have x-rays taken 07/24/2016 no independent living visualized by me showing slight narrowing of the medial joint space bilaterally with some calcific changes of the patellar tendon. Small effusions bilaterally.   History reviewed. No pertinent past medical history.  Patient Active Problem List   Diagnosis Date Noted  . Intraligamentous cyst of knee, right 09/17/2016  . Quadriceps tendinitis 08/19/2016  . Degenerative arthritis of knee, bilateral 08/19/2016  . Knee MCL sprain 05/01/2015  . Neck pain 05/02/2014  . Nonallopathic lesion of cervical region 05/02/2014  . Greater trochanteric bursitis of right hip 03/03/2014  . Strain of flexor muscle of right hip 02/03/2014  . Piriformis syndrome of right side 02/03/2014    History reviewed. No pertinent surgical history. Social History   Social History  . Marital status: Single    Spouse name: N/A  . Number of children: N/A  . Years of education: N/A   Social History Main Topics  . Smoking status: Former Games developer  . Smokeless tobacco: Never Used  . Alcohol use None  . Drug use: Unknown  . Sexual activity: Not Asked   Other Topics Concern  . None   Social History Narrative  . None   No Known Allergies History reviewed. No pertinent family history. No family history rheumatological diseases.  Past medical history, social, surgical and family history all  reviewed in electronic medical record.  No pertanent information unless stated regarding to the chief complaint.   Review of Systems: No headache, visual changes, nausea, vomiting, diarrhea, constipation, dizziness, abdominal pain, skin rash, fevers, chills, night sweats, weight loss, swollen lymph nodes, chest pain, shortness of breath, mood changes.  Positive joint swelling and muscle aches    Objective  Blood pressure 138/90, pulse 71, resp. rate 16, weight 216 lb (98 kg), SpO2 97 %.   Systems examined below as of 01/23/17 General: NAD A&O x3 mood, affect normal  HEENT: Pupils equal, extraocular movements intact no nystagmus Respiratory: not short of breath at rest or with speaking Cardiovascular: No lower extremity edema, non tender Skin: Warm dry intact with no signs of infection or rash on extremities or on axial skeleton. Abdomen: Soft nontender, no masses Neuro: Cranial nerves  intact, neurovascularly intact in all extremities with 2+ DTRs and 2+ pulses. Lymph: No lymphadenopathy appreciated today  Gait normal with good balance and coordination.  MSK: Non tender with full range of motion and good stability and symmetric strength and tone of shoulders, elbows, wrist,  hips and ankles bilaterally.    Knee: Right Normal to inspection with no erythema or effusion or obvious bony abnormalities. Very mild discomfort over the superior lateral and medial joint space ROM full in flexion and extension and lower leg rotation. Ligaments with solid consistent endpoints including ACL, PCL, LCL, MCL. Negative Mcmurray's, Apley's, and Thessalonian tests. Non painful patellar compression. Patellar glide with mild crepitus. Patellar and  quadriceps tendons unremarkable. Hamstring and quadriceps strength is normal.    MSK US performed of: Right knee This study was ordered, performed, and interpreted by Terrilee FilesZach Bryann Mcnealy D.O.  Knee: All structures visualized. Mild narrowing of the medial joint  space with a degenerative meniscal tear. Trace effusion noted with potential loose bodies.   IMPRESSION:  Trace effusion but no true cyst noted today.      Impression and Recommendations:     This case required medical decision making of moderate complexity.      Note: This dictation was prepared with Dragon dictation along with smaller phrase technology. Any transcriptional errors that result from this process are unintentional.

## 2017-01-22 ENCOUNTER — Encounter: Payer: Self-pay | Admitting: Family Medicine

## 2017-01-22 ENCOUNTER — Ambulatory Visit (INDEPENDENT_AMBULATORY_CARE_PROVIDER_SITE_OTHER): Payer: BC Managed Care – PPO | Admitting: Family Medicine

## 2017-01-22 ENCOUNTER — Ambulatory Visit: Payer: Self-pay

## 2017-01-22 VITALS — BP 138/90 | HR 71 | Resp 16 | Wt 216.0 lb

## 2017-01-22 DIAGNOSIS — M170 Bilateral primary osteoarthritis of knee: Secondary | ICD-10-CM

## 2017-01-22 DIAGNOSIS — M238X1 Other internal derangements of right knee: Secondary | ICD-10-CM | POA: Diagnosis not present

## 2017-01-22 DIAGNOSIS — M25562 Pain in left knee: Secondary | ICD-10-CM

## 2017-01-22 NOTE — Patient Instructions (Signed)
Good to see you  Anthony Burns is your friend. Ice 20 minutes 2 times daily. Usually after activity and before bed. Maybe the pennsaid Start the exercises agin 3 times a week.  Bike is better than walking.  Vitamin D and turmeric 500mg  at least daily  See me again in 4 weeks if not better

## 2017-01-23 NOTE — Assessment & Plan Note (Signed)
Stable overall. Continue to monitor. 

## 2017-01-23 NOTE — Assessment & Plan Note (Signed)
Patient was concern he was having another cyst in the knee. I do not see that today. Patient did have a small effusion. We discussed the possibility of aspiration with patient considered but would like to try conservative therapy. We discussed icing regimen and home exercises. Topical anti-inflammatories were given. Discussed all other options.  Spent  25 minutes with patient face-to-face and had greater than 50% of counseling including as described above in assessment and plan.

## 2018-07-21 ENCOUNTER — Other Ambulatory Visit: Payer: Self-pay | Admitting: Family Medicine

## 2018-07-21 ENCOUNTER — Ambulatory Visit
Admission: RE | Admit: 2018-07-21 | Discharge: 2018-07-21 | Disposition: A | Discharge status: Home / Self Care | Payer: BC Managed Care – PPO | Source: Ambulatory Visit | Attending: Family Medicine | Admitting: Family Medicine

## 2018-07-21 DIAGNOSIS — I10 Essential (primary) hypertension: Secondary | ICD-10-CM

## 2018-07-21 DIAGNOSIS — E291 Testicular hypofunction: Secondary | ICD-10-CM

## 2018-07-21 DIAGNOSIS — E782 Mixed hyperlipidemia: Secondary | ICD-10-CM

## 2018-07-21 DIAGNOSIS — R059 Cough, unspecified: Secondary | ICD-10-CM

## 2018-07-21 DIAGNOSIS — B370 Candidal stomatitis: Secondary | ICD-10-CM

## 2018-07-21 DIAGNOSIS — E039 Hypothyroidism, unspecified: Secondary | ICD-10-CM

## 2018-07-21 DIAGNOSIS — Z77090 Contact with and (suspected) exposure to asbestos: Secondary | ICD-10-CM

## 2018-07-21 DIAGNOSIS — R05 Cough: Secondary | ICD-10-CM

## 2018-07-21 DIAGNOSIS — E781 Pure hyperglyceridemia: Secondary | ICD-10-CM

## 2020-05-03 ENCOUNTER — Ambulatory Visit
Admission: RE | Admit: 2020-05-03 | Discharge: 2020-05-03 | Disposition: A | Discharge status: Home / Self Care | Payer: Self-pay | Source: Ambulatory Visit | Attending: Family Medicine | Admitting: Family Medicine

## 2020-05-03 ENCOUNTER — Other Ambulatory Visit: Payer: Self-pay | Admitting: Family Medicine

## 2020-05-03 ENCOUNTER — Other Ambulatory Visit: Payer: Self-pay

## 2020-05-03 DIAGNOSIS — R52 Pain, unspecified: Secondary | ICD-10-CM

## 2022-06-07 IMAGING — CR DG KNEE COMPLETE 4+V*L*
4 series · 4 of 4 positions shown · non-contrast
Comparison: 07/24/2016

CLINICAL DATA: Intermittent left knee pain

EXAM:
LEFT KNEE - COMPLETE 4+ VIEW

[x knee sunrise right]
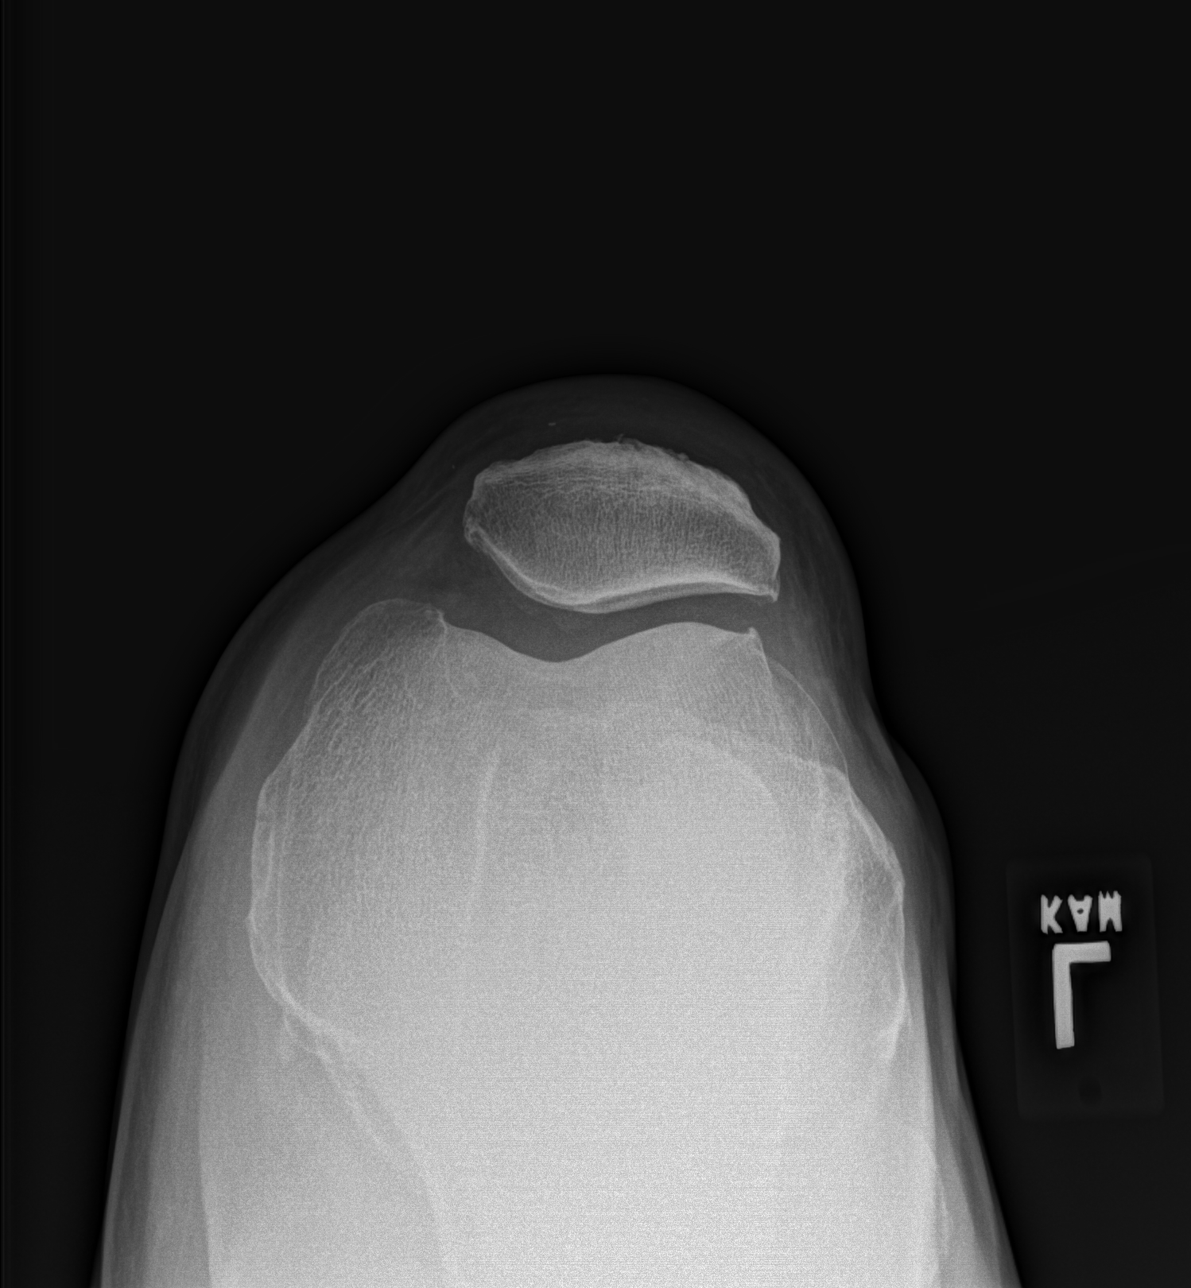

[w knee ap left]
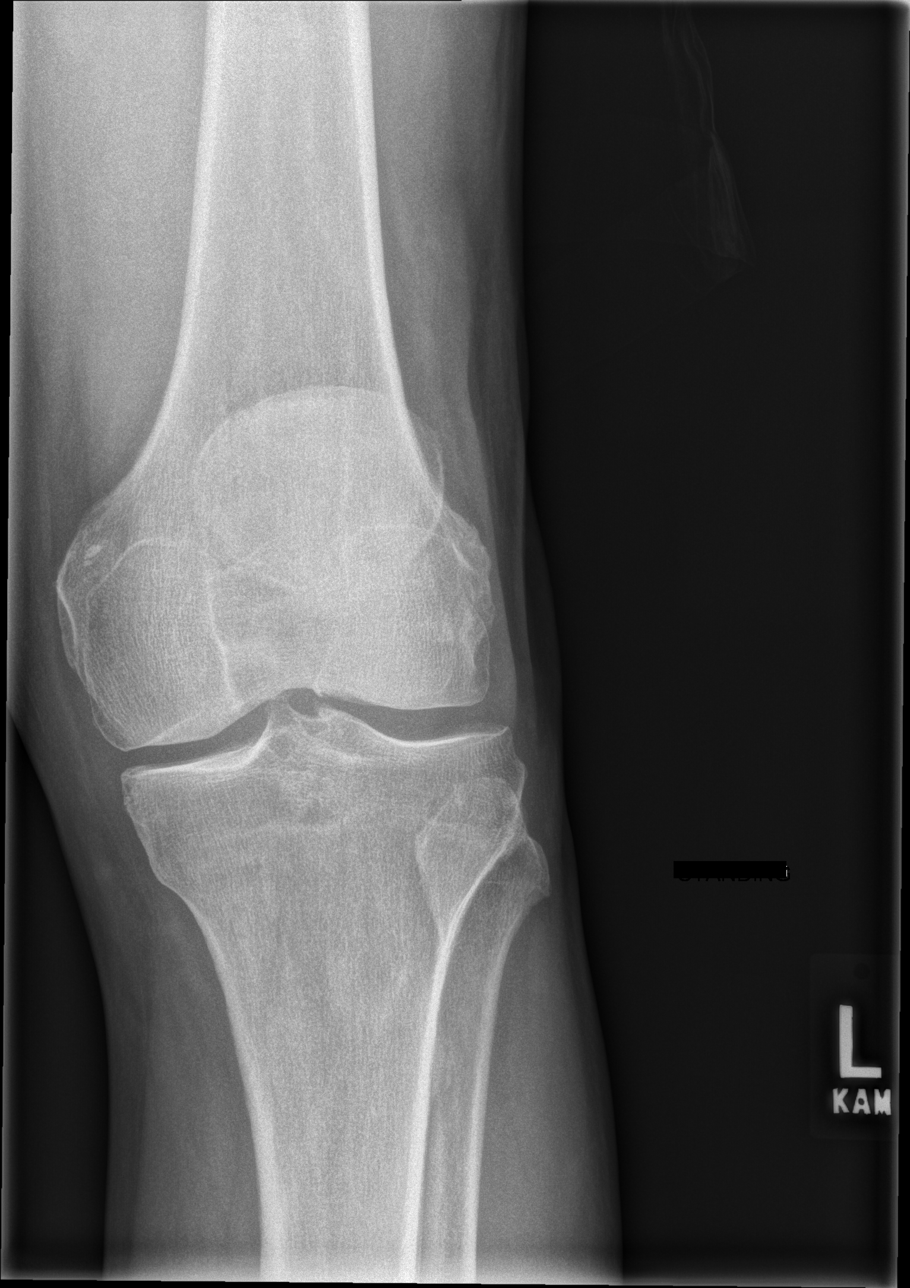

[w knee lat left]
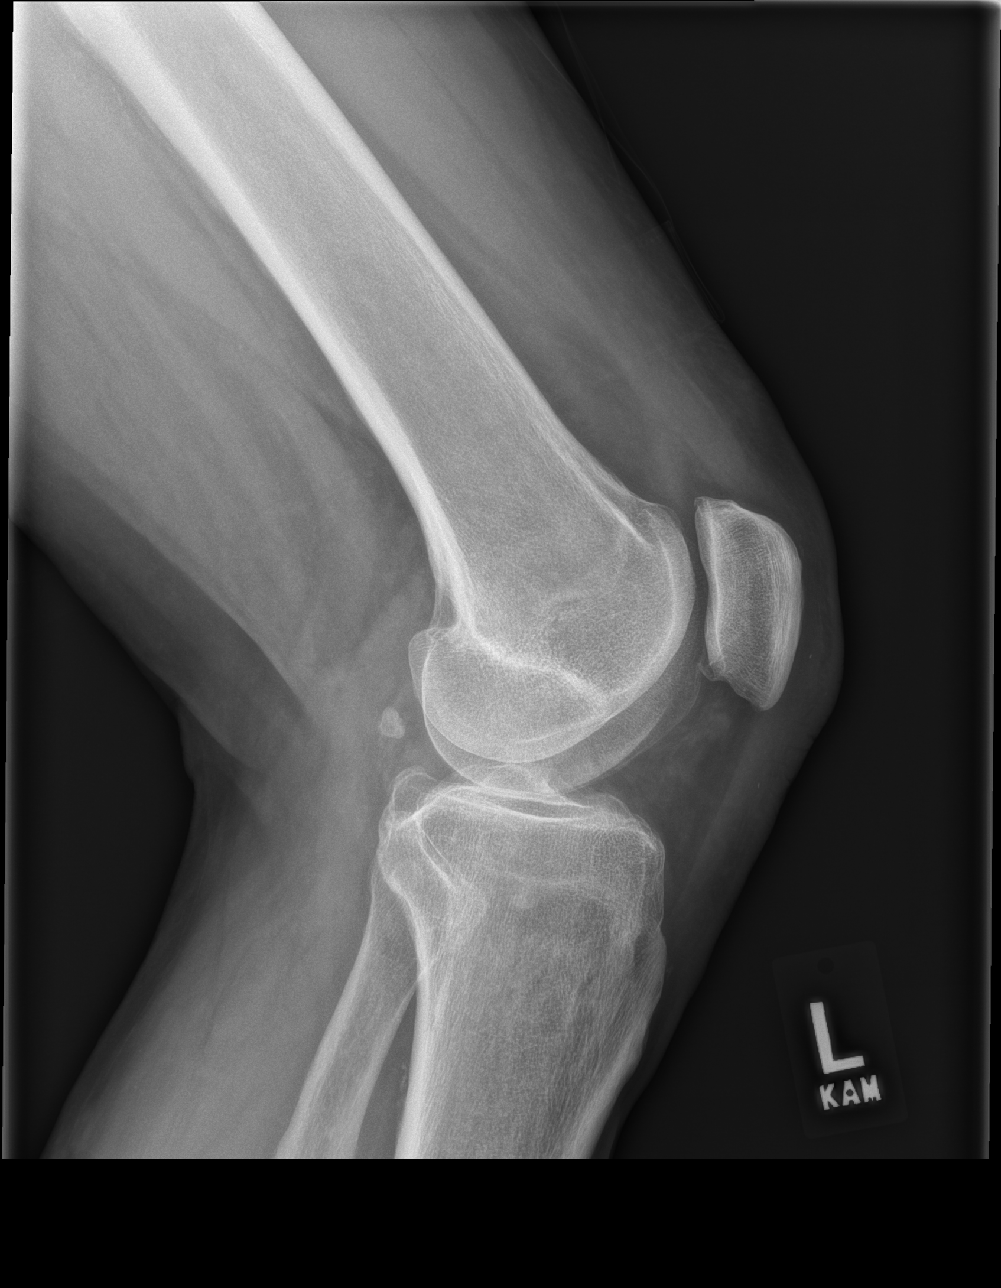

[w knee tunnel pa left]
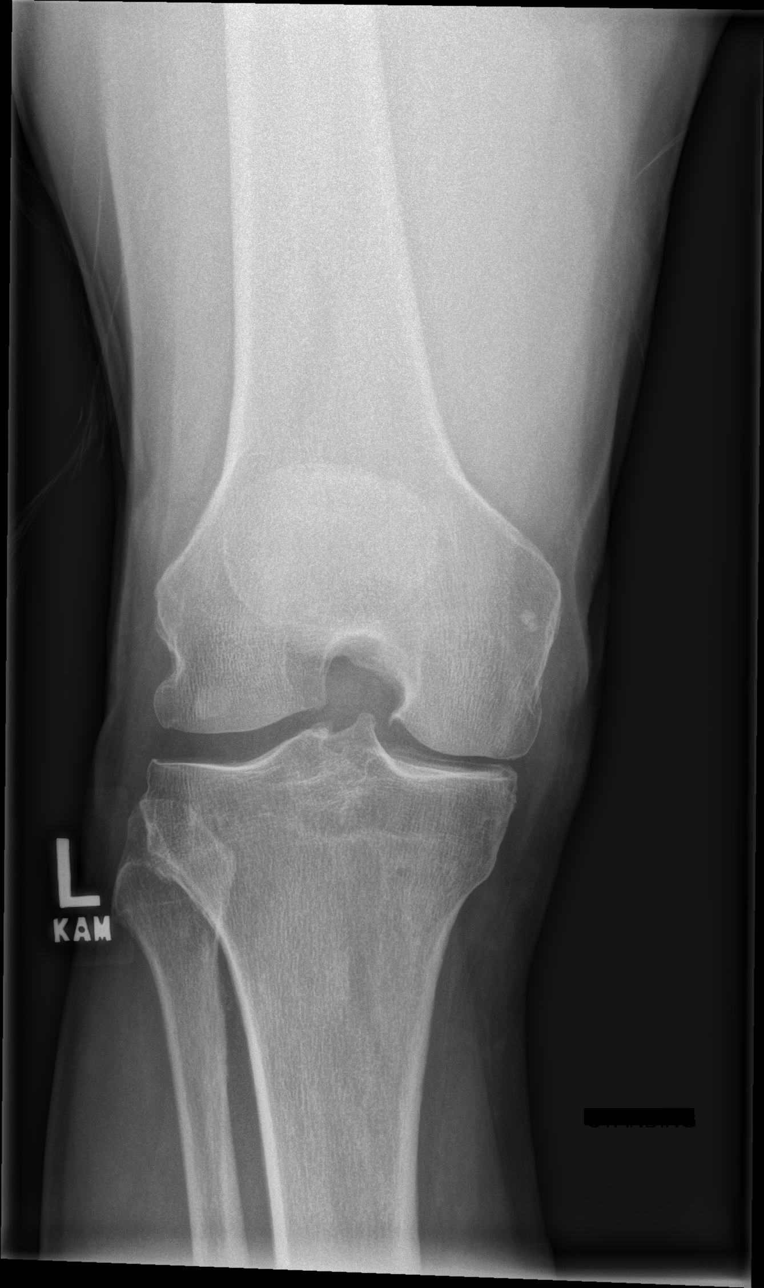

[4 of 4 positions shown; findings below may reference images not displayed]

FINDINGS: No acute fracture. No dislocation. Mild-moderate medial compartment
joint space narrowing, slightly progressed from prior. No
significant knee joint effusion. Soft tissues unremarkable.
IMPRESSION: Mild-moderate medial compartment joint space narrowing of the left
knee, slightly progressed from prior.

## 2022-06-07 IMAGING — CR DG KNEE COMPLETE 4+V*R*
4 series · 4 of 4 positions shown · non-contrast
Comparison: 07/24/2016

CLINICAL DATA: Intermittent right knee pain

EXAM:
RIGHT KNEE - COMPLETE 4+ VIEW

[x knee sunrise right]
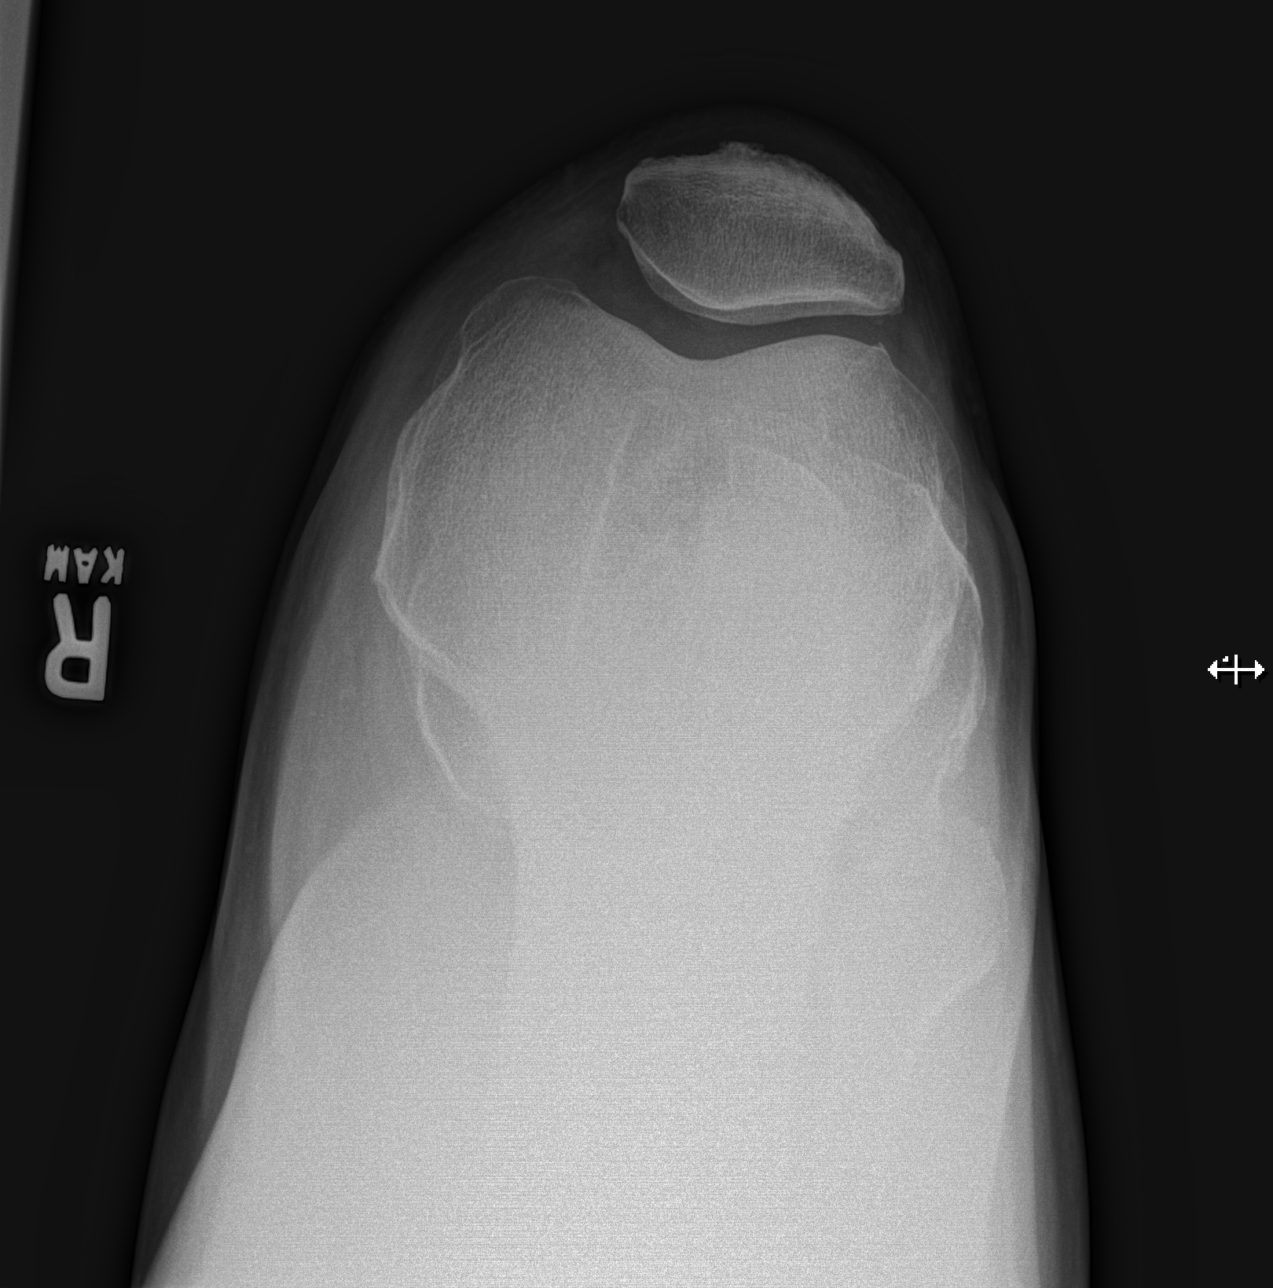

[w knee ap right]
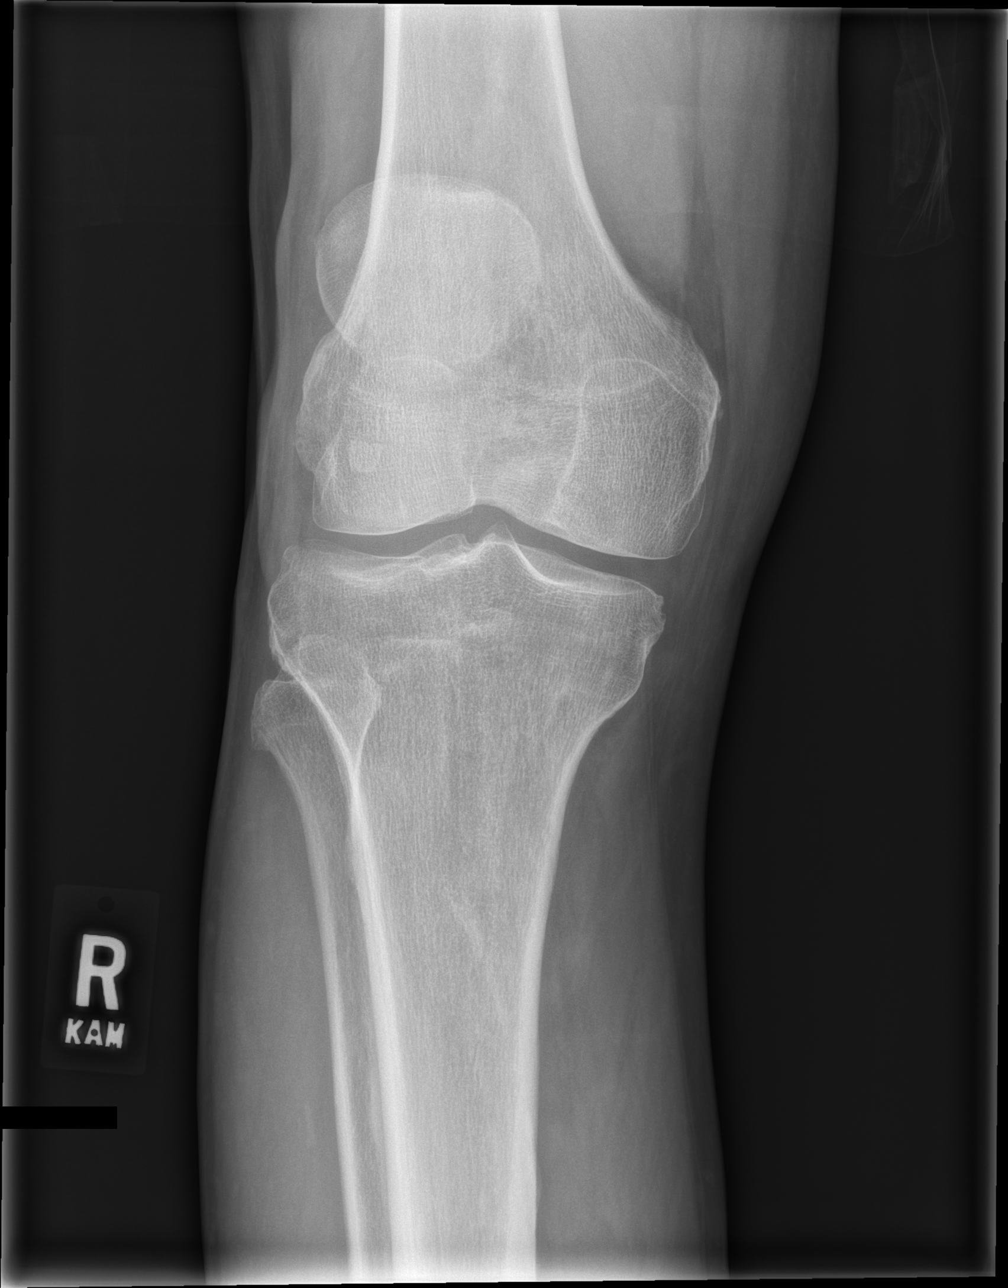

[w knee lat right]
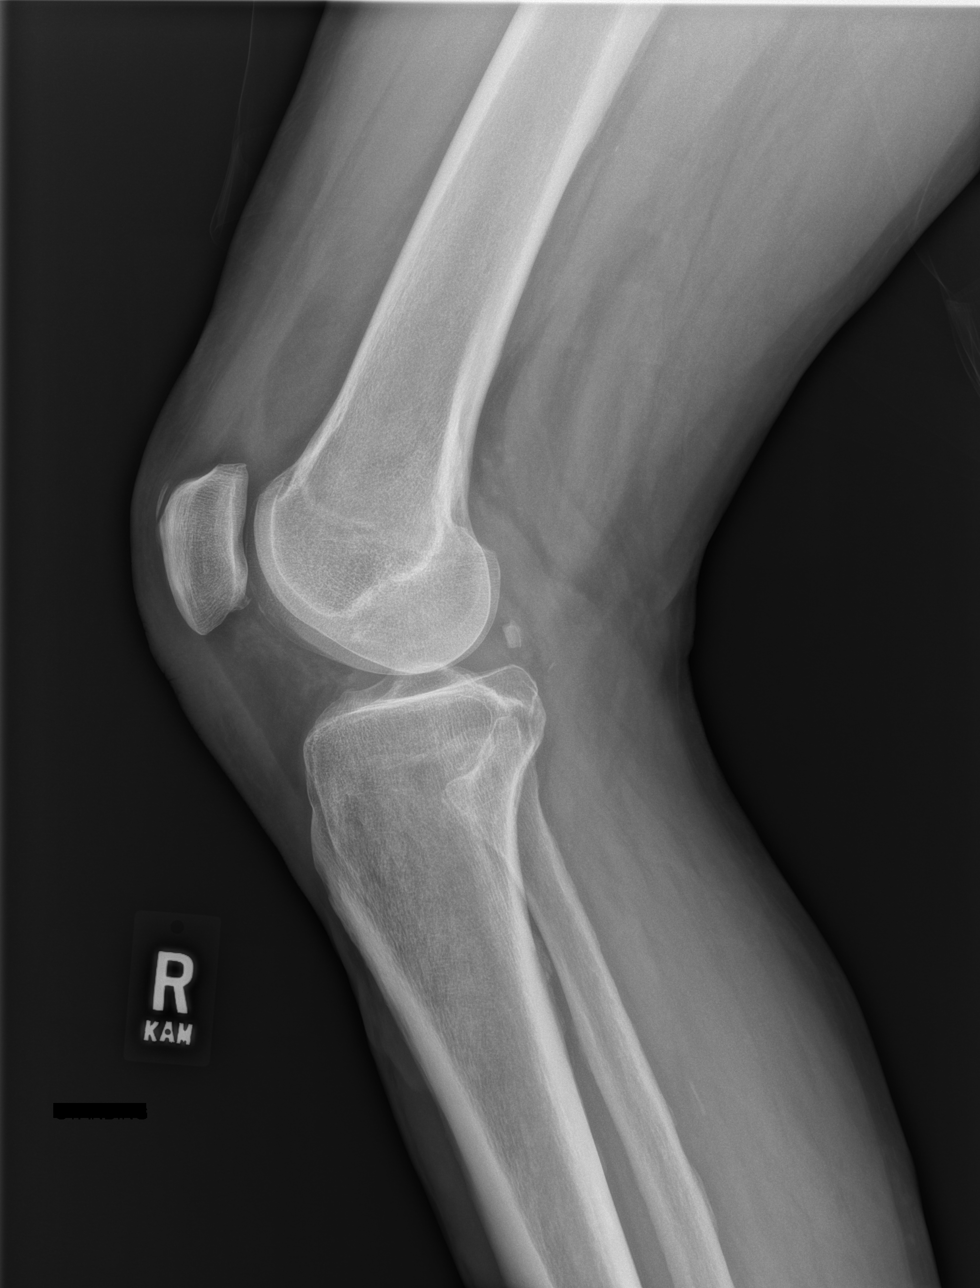

[w knee tunnel pa right]
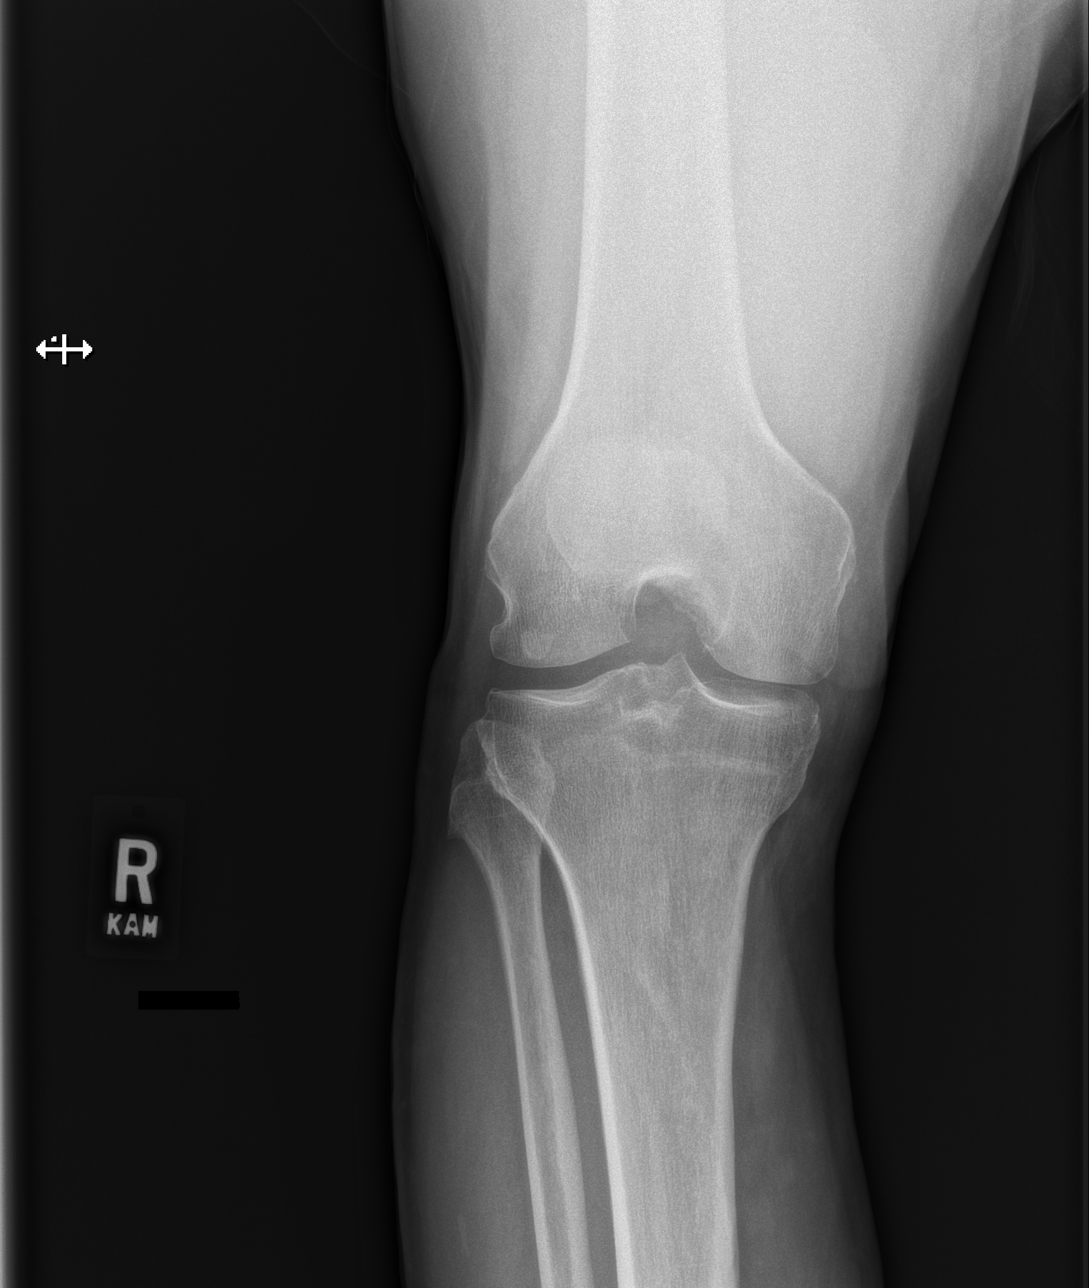

[4 of 4 positions shown; findings below may reference images not displayed]

FINDINGS: No acute fracture or dislocation. Joint spaces are preserved. No
significant knee joint effusion. Small distal quadriceps
enthesophyte. Soft tissues within normal limits.
IMPRESSION: No acute osseous abnormality or significant arthropathy of the right
knee.

## 2022-10-01 ENCOUNTER — Other Ambulatory Visit: Payer: Self-pay | Admitting: Nurse Practitioner

## 2022-10-01 DIAGNOSIS — M5416 Radiculopathy, lumbar region: Secondary | ICD-10-CM

## 2022-10-23 ENCOUNTER — Ambulatory Visit
Admission: RE | Admit: 2022-10-23 | Discharge: 2022-10-23 | Disposition: A | Discharge status: Home / Self Care | Payer: Self-pay | Source: Ambulatory Visit | Attending: Nurse Practitioner | Admitting: Nurse Practitioner

## 2022-10-23 DIAGNOSIS — M5416 Radiculopathy, lumbar region: Secondary | ICD-10-CM

## 2022-10-26 ENCOUNTER — Other Ambulatory Visit: Payer: BC Managed Care – PPO

## 2023-08-25 ENCOUNTER — Other Ambulatory Visit: Payer: Self-pay | Admitting: Orthopedic Surgery

## 2023-08-25 DIAGNOSIS — M48062 Spinal stenosis, lumbar region with neurogenic claudication: Secondary | ICD-10-CM

## 2023-08-26 ENCOUNTER — Other Ambulatory Visit: Payer: Self-pay | Admitting: Orthopedic Surgery

## 2023-08-26 DIAGNOSIS — M48062 Spinal stenosis, lumbar region with neurogenic claudication: Secondary | ICD-10-CM

## 2023-08-27 ENCOUNTER — Encounter: Payer: Self-pay | Admitting: Orthopedic Surgery

## 2023-09-01 ENCOUNTER — Encounter: Payer: Self-pay | Admitting: Orthopedic Surgery

## 2023-09-15 ENCOUNTER — Ambulatory Visit
Admission: RE | Admit: 2023-09-15 | Discharge: 2023-09-15 | Disposition: A | Discharge status: Home / Self Care | Payer: BC Managed Care – PPO | Source: Ambulatory Visit | Attending: Orthopedic Surgery | Admitting: Orthopedic Surgery

## 2023-09-15 DIAGNOSIS — M48062 Spinal stenosis, lumbar region with neurogenic claudication: Secondary | ICD-10-CM

## 2023-10-20 ENCOUNTER — Other Ambulatory Visit: Payer: Self-pay | Admitting: Orthopedic Surgery

## 2023-10-20 DIAGNOSIS — M4316 Spondylolisthesis, lumbar region: Secondary | ICD-10-CM

## 2023-10-23 ENCOUNTER — Encounter: Payer: Self-pay | Admitting: Orthopedic Surgery

## 2023-10-27 ENCOUNTER — Other Ambulatory Visit: Payer: Self-pay

## 2023-10-27 ENCOUNTER — Encounter (HOSPITAL_COMMUNITY): Payer: Self-pay | Admitting: Pharmacy Technician

## 2023-10-27 ENCOUNTER — Emergency Department (HOSPITAL_COMMUNITY)
Admission: EM | Admit: 2023-10-27 | Discharge: 2023-10-27 | Disposition: A | Discharge status: Home / Self Care | Payer: 59 | Attending: Emergency Medicine | Admitting: Emergency Medicine

## 2023-10-27 DIAGNOSIS — I10 Essential (primary) hypertension: Secondary | ICD-10-CM | POA: Diagnosis present

## 2023-10-27 DIAGNOSIS — Z79899 Other long term (current) drug therapy: Secondary | ICD-10-CM | POA: Diagnosis not present

## 2023-10-27 HISTORY — DX: Essential (primary) hypertension: I10

## 2023-10-27 LAB — CBC WITH DIFFERENTIAL/PLATELET
Abs Immature Granulocytes: 0.02 10*3/uL (ref 0.00–0.07)
Basophils Absolute: 0.1 10*3/uL (ref 0.0–0.1)
Basophils Relative: 1 %
Eosinophils Absolute: 0.4 10*3/uL (ref 0.0–0.5)
Eosinophils Relative: 5 %
HCT: 41.8 % (ref 39.0–52.0)
Hemoglobin: 13.8 g/dL (ref 13.0–17.0)
Immature Granulocytes: 0 %
Lymphocytes Relative: 16 %
Lymphs Abs: 1.4 10*3/uL (ref 0.7–4.0)
MCH: 30.3 pg (ref 26.0–34.0)
MCHC: 33 g/dL (ref 30.0–36.0)
MCV: 91.9 fL (ref 80.0–100.0)
Monocytes Absolute: 0.8 10*3/uL (ref 0.1–1.0)
Monocytes Relative: 9 %
Neutro Abs: 5.8 10*3/uL (ref 1.7–7.7)
Neutrophils Relative %: 69 %
Platelets: 295 10*3/uL (ref 150–400)
RBC: 4.55 MIL/uL (ref 4.22–5.81)
RDW: 12.5 % (ref 11.5–15.5)
WBC: 8.5 10*3/uL (ref 4.0–10.5)
nRBC: 0 % (ref 0.0–0.2)

## 2023-10-27 LAB — COMPREHENSIVE METABOLIC PANEL
ALT: 37 U/L (ref 0–44)
AST: 25 U/L (ref 15–41)
Albumin: 3.7 g/dL (ref 3.5–5.0)
Alkaline Phosphatase: 51 U/L (ref 38–126)
Anion gap: 10 (ref 5–15)
BUN: 24 mg/dL — ABNORMAL HIGH (ref 8–23)
CO2: 25 mmol/L (ref 22–32)
Calcium: 9.6 mg/dL (ref 8.9–10.3)
Chloride: 104 mmol/L (ref 98–111)
Creatinine, Ser: 0.92 mg/dL (ref 0.61–1.24)
GFR, Estimated: >60 mL/min (ref >60)
Glucose, Bld: 110 mg/dL — ABNORMAL HIGH (ref 70–99)
Potassium: 4 mmol/L (ref 3.5–5.1)
Sodium: 139 mmol/L (ref 135–145)
Total Bilirubin: 0.5 mg/dL (ref 0.0–1.2)
Total Protein: 7 g/dL (ref 6.5–8.1)

## 2023-10-27 MED ORDER — LOSARTAN POTASSIUM 50 MG PO TABS
50.0000 mg | ORAL_TABLET | Freq: Two times a day (BID) | ORAL | 0 refills | Status: DC
Start: 2023-10-27 — End: 2023-12-06

## 2023-10-27 NOTE — ED Provider Triage Note (Signed)
 Emergency Medicine Provider Triage Evaluation Note  Anthony Burns , a 78 y.o. male  was evaluated in triage.  Pt complains of hypertension.  Patient reports he needs a back surgery and needs medical clearance from his surgeon to have this back surgery.  Patient reports his blood pressure has been too high and that his surgeon wants his blood pressure 120/80.  Was seen sometime in January by cardiology for clearance who did not medically clear him because he was having high blood pressure.  Today he denies any headache, chest pain, shortness of breath or other symptoms.  Review of Systems  Positive: HTN Negative: HA, CP  Physical Exam  BP (!) 163/72 (BP Location: Right Arm)   Pulse 62   Temp (!) 97.5 F (36.4 C)   Resp 16   SpO2 98%  Gen:   Awake, no distress   Resp:  Normal effort  MSK:   Moves extremities without difficulty  Other:    Medical Decision Making  Medically screening exam initiated at 3:43 PM.  Appropriate orders placed.  KASPER GALLUCCI was informed that the remainder of the evaluation will be completed by another provider, this initial triage assessment does not replace that evaluation, and the importance of remaining in the ED until their evaluation is complete.     Eudora Heron, PA-C 10/27/23 1544

## 2023-10-27 NOTE — ED Provider Notes (Signed)
  [place] EMERGENCY DEPARTMENT AT [facility] Provider Note   CSN: [id number] Arrival date & time: 10/27/23  1408     History  Chief Complaint  Patient presents with   Hypertension    Anthony Burns is a 78 y.o. male.  78 year old male here today because his back surgeon needs his blood pressure to be lower to do an elective surgery.  Patient does not have a PCP.  He takes losartan /hydrochlorothiazide.   Hypertension       Home Medications Prior to Admission medications   Medication Sig Start Date End Date Taking? Authorizing Provider  losartan  (COZAAR ) 50 MG tablet Take 1 tablet (50 mg total) by mouth 2 (two) times daily. 10/27/23 11/26/23 Yes Afton Horse T, DO  ANDROGEL PUMP 20.25 MG/ACT (1.62%) GEL  07/29/16   [provider]      Allergies    Patient has no known allergies.    Review of Systems   Review of Systems  Physical Exam Updated Vital Signs BP (!) 163/72 (BP Location: Right Arm)   Pulse 63   Temp (!) 97.5 F (36.4 C)   Resp 16   SpO2 98%  Physical Exam Vitals reviewed.  Cardiovascular:     Rate and Rhythm: Normal rate.  Pulmonary:     Effort: Pulmonary effort is normal.  Neurological:     General: No focal deficit present.     Mental Status: He is alert.     ED Results / Procedures / Treatments   Labs (all labs ordered are listed, but only abnormal results are displayed) Labs Reviewed  COMPREHENSIVE METABOLIC PANEL - Abnormal; Notable for the following components:      Result Value   Glucose, Bld 110 (*)    BUN 24 (*)    All other components within normal limits  CBC WITH DIFFERENTIAL/PLATELET    EKG None  Radiology No results found.  Procedures Procedures    Medications Ordered in ED Medications - No data to display  ED Course/ Medical Decision Making/ A&P                                 Medical Decision Making 78 year old male here today because his spine surgeon believes his blood pressure  is too high to operate on him and he does not have a PCP.  Plan-will make some changes to the patient's home regimen.  He is going to establish care with a new PCP.  Labs reviewed.  Patient's health care complicated by the following social determinants of health-lack of access to primary care.           Final Clinical Impression(s) / ED Diagnoses Final diagnoses:  Primary hypertension    Rx / DC Orders ED Discharge Orders          Ordered    losartan  (COZAAR ) 50 MG tablet  2 times daily        10/27/23 1851              Afton Horse T, DO 10/27/23 1851

## 2023-10-27 NOTE — Discharge Instructions (Signed)
 Stop taking your current blood pressure medication.  You may begin taking 50 mg of losartan  2 times per day.  Start checking your blood pressure 2 times daily.  Including discharge paperwork is a telephone number to establish care with a primary care doctor.  Please call this number and begin seeing a PCP for long-term management of your hypertension.

## 2023-10-27 NOTE — ED Triage Notes (Signed)
 Pt here POV with reports of having high blood pressure. Denies headaches.

## 2023-11-01 ENCOUNTER — Ambulatory Visit
Admission: RE | Admit: 2023-11-01 | Discharge: 2023-11-01 | Disposition: A | Discharge status: Home / Self Care | Payer: 59 | Source: Ambulatory Visit

## 2023-11-01 DIAGNOSIS — M4316 Spondylolisthesis, lumbar region: Secondary | ICD-10-CM

## 2023-12-06 ENCOUNTER — Encounter (HOSPITAL_COMMUNITY): Payer: Self-pay | Admitting: Emergency Medicine

## 2023-12-06 ENCOUNTER — Ambulatory Visit (HOSPITAL_COMMUNITY)
Admission: EM | Admit: 2023-12-06 | Discharge: 2023-12-06 | Disposition: A | Discharge status: Home / Self Care | Attending: Family Medicine | Admitting: Family Medicine

## 2023-12-06 DIAGNOSIS — Z760 Encounter for issue of repeat prescription: Secondary | ICD-10-CM

## 2023-12-06 DIAGNOSIS — I10 Essential (primary) hypertension: Secondary | ICD-10-CM | POA: Diagnosis not present

## 2023-12-06 MED ORDER — LOSARTAN POTASSIUM 50 MG PO TABS: 50.0000 mg | ORAL_TABLET | Freq: Two times a day (BID) | ORAL | 1 refills | Status: DC

## 2023-12-06 NOTE — ED Triage Notes (Signed)
 Patient requesting a refill on his BP medication Losartan 50mg .  Patient does not have a PCP and would like to get established w/one.

## 2023-12-08 NOTE — ED Provider Notes (Signed)
 [facility]   [id number] 12/06/23 Arrival Time: 1122  ASSESSMENT & PLAN:  1. Essential hypertension   2. Medication refill    HTN controlled. Meds ordered this encounter  Medications   losartan (COZAAR) 50 MG tablet    Sig: Take 1 tablet (50 mg total) by mouth 2 (two) times daily.    Dispense:  60 tablet    Refill:  1   Mobile clinic schedule given.   Follow-up Information     [facility].   Specialty: Urgent Care Why: As needed. Contact information: [address] [place] [address] [phone]                Reviewed expectations re: course of current medical issues. Questions answered. Outlined signs and symptoms indicating need for more acute intervention. Patient verbalized understanding. After Visit Summary given.   SUBJECTIVE:  Anthony Burns is a 78 y.o. male who request BP med refill. Feels well. BP controlled. No reg PCP.   Social History   Tobacco Use  Smoking Status Former  Smokeless Tobacco Never      OBJECTIVE:  Vitals:   12/06/23 1139 12/06/23 1141  BP: 121/71   Pulse: 63   Resp: 18   Temp: 98 F (36.7 C)   TempSrc: Oral   SpO2: 94%   Weight:  90.7 kg  Height:  6\' 2"  (1.88 m)    General appearance: alert; no distress Heart: regular Extremities: no edema; symmetrical with no gross deformities Skin: warm and dry Psychological: alert and cooperative; normal mood and affect  ECG: No orders found for this or any previous visit.  Labs Reviewed: Results for orders placed or performed during the hospital encounter of 10/27/23  CBC with Differential   Collection Time: 10/27/23  3:57 PM  Result Value Ref Range   WBC 8.5 4.0 - 10.5 K/uL   RBC 4.55 4.22 - 5.81 MIL/uL   Hemoglobin 13.8 13.0 - 17.0 g/dL   HCT 44.0 34.7 - 42.5 %   MCV 91.9 80.0 - 100.0 fL   MCH 30.3 26.0 - 34.0 pg   MCHC 33.0 30.0 - 36.0 g/dL   RDW 95.6 38.7 - 56.4 %   Platelets 295 150 - 400 K/uL    nRBC 0.0 0.0 - 0.2 %   Neutrophils Relative % 69 %   Neutro Abs 5.8 1.7 - 7.7 K/uL   Lymphocytes Relative 16 %   Lymphs Abs 1.4 0.7 - 4.0 K/uL   Monocytes Relative 9 %   Monocytes Absolute 0.8 0.1 - 1.0 K/uL   Eosinophils Relative 5 %   Eosinophils Absolute 0.4 0.0 - 0.5 K/uL   Basophils Relative 1 %   Basophils Absolute 0.1 0.0 - 0.1 K/uL   Immature Granulocytes 0 %   Abs Immature Granulocytes 0.02 0.00 - 0.07 K/uL  Comprehensive metabolic panel   Collection Time: 10/27/23  3:57 PM  Result Value Ref Range   Sodium 139 135 - 145 mmol/L   Potassium 4.0 3.5 - 5.1 mmol/L   Chloride 104 98 - 111 mmol/L   CO2 25 22 - 32 mmol/L   Glucose, Bld 110 (H) 70 - 99 mg/dL   BUN 24 (H) 8 - 23 mg/dL   Creatinine, Ser 3.32 0.61 - 1.24 mg/dL   Calcium 9.6 8.9 - 95.1 mg/dL   Total Protein 7.0 6.5 - 8.1 g/dL   Albumin 3.7 3.5 - 5.0 g/dL   AST 25 15 - 41 U/L  ALT 37 0 - 44 U/L   Alkaline Phosphatase 51 38 - 126 U/L   Total Bilirubin 0.5 0.0 - 1.2 mg/dL   GFR, Estimated >82 >95 mL/min   Anion gap 10 5 - 15   Labs Reviewed - No data to display  Imaging: No results found.  Allergies  Allergen Reactions   Dimercaprol Itching    Eye itching   Yeast Hives    Past Medical History:  Diagnosis Date   Hypertension    Social History   Socioeconomic History   Marital status: Single    Spouse name: Not on file   Number of children: Not on file   Years of education: Not on file   Highest education level: Not on file  Occupational History   Not on file  Tobacco Use   Smoking status: Former   Smokeless tobacco: Never  Vaping Use   Vaping status: Never Used  Substance and Sexual Activity   Alcohol use: Not Currently   Drug use: Never   Sexual activity: Not Currently  Other Topics Concern   Not on file  Social History Narrative   Not on file   Social Drivers of Health   Financial Resource Strain: Low Risk  (10/07/2023)   Received from [facility]   Overall  Financial Resource Strain (CARDIA)    Difficulty of Paying Living Expenses: Not hard at all  Food Insecurity: No Food Insecurity (10/07/2023)   Received from [facility]   Hunger Vital Sign    Worried About Running Out of Food in the Last Year: Never true    Ran Out of Food in the Last Year: Never true  Transportation Needs: No Transportation Needs (10/07/2023)   Received from [facility] - Transportation    In the past 12 months, has lack of transportation kept you from medical appointments or from getting medications?: No    Lack of Transportation (Non-Medical): No  Physical Activity: Not on file  Stress: Not on file  Social Connections: Not on file  Intimate Partner Violence: Not on file   History reviewed. No pertinent family history. History reviewed. No pertinent surgical history.     Mardella Layman, MD 12/08/23 [phone]

## 2024-01-16 ENCOUNTER — Other Ambulatory Visit: Payer: Self-pay

## 2024-01-16 DIAGNOSIS — M542 Cervicalgia: Secondary | ICD-10-CM

## 2024-01-16 DIAGNOSIS — M4015 Other secondary kyphosis, thoracolumbar region: Secondary | ICD-10-CM

## 2024-01-16 DIAGNOSIS — M4030 Flatback syndrome, site unspecified: Secondary | ICD-10-CM

## 2024-01-16 DIAGNOSIS — M4316 Spondylolisthesis, lumbar region: Secondary | ICD-10-CM

## 2024-01-16 DIAGNOSIS — M48062 Spinal stenosis, lumbar region with neurogenic claudication: Secondary | ICD-10-CM

## 2024-01-25 ENCOUNTER — Ambulatory Visit
Admission: RE | Admit: 2024-01-25 | Discharge: 2024-01-25 | Disposition: A | Discharge status: Home / Self Care | Source: Ambulatory Visit

## 2024-01-25 DIAGNOSIS — M4030 Flatback syndrome, site unspecified: Secondary | ICD-10-CM

## 2024-01-25 DIAGNOSIS — M542 Cervicalgia: Secondary | ICD-10-CM

## 2024-01-25 DIAGNOSIS — M4015 Other secondary kyphosis, thoracolumbar region: Secondary | ICD-10-CM

## 2024-01-25 DIAGNOSIS — M48062 Spinal stenosis, lumbar region with neurogenic claudication: Secondary | ICD-10-CM

## 2024-01-25 DIAGNOSIS — M4316 Spondylolisthesis, lumbar region: Secondary | ICD-10-CM

## 2024-03-01 ENCOUNTER — Ambulatory Visit: Admitting: Cardiology

## 2024-03-12 ENCOUNTER — Ambulatory Visit (HOSPITAL_COMMUNITY)
Admission: EM | Admit: 2024-03-12 | Discharge: 2024-03-12 | Disposition: A | Discharge status: Home / Self Care | Attending: Emergency Medicine | Admitting: Emergency Medicine

## 2024-03-12 ENCOUNTER — Encounter (HOSPITAL_COMMUNITY): Payer: Self-pay

## 2024-03-12 DIAGNOSIS — I10 Essential (primary) hypertension: Secondary | ICD-10-CM

## 2024-03-12 DIAGNOSIS — Z760 Encounter for issue of repeat prescription: Secondary | ICD-10-CM

## 2024-03-12 MED ORDER — LOSARTAN POTASSIUM-HCTZ 50-12.5 MG PO TABS: 1.0000 | ORAL_TABLET | Freq: Every day | ORAL | 0 refills | Status: DC

## 2024-03-12 NOTE — ED Provider Notes (Signed)
 [facility]    CSN: [id number] Arrival date & time: 03/12/24  1021      History   Chief Complaint Chief Complaint  Patient presents with   Medication Refill    HPI Anthony Burns is a 78 y.o. male.  Presents for refill of his blood pressure medication.    He is followed by cardiology, last saw them 2 months ago in April. Cancelled last appointment 2 weeks ago.  Per cardiology note he is supposed to take Hyzaar 50-12.5 mg daily.  However he was seen here in the past (March) and given Losartan  50 mg twice daily. This is the bottle that he brought with him today. He is not sure which one he has been taking. Ran out of medication 3 days ago.  Next primary care visit is in 2 weeks on 03/26/2024  Not having headache, dizziness, vision changes, chest pain or tightness, shortness of breath, abdominal pain, extremity weakness   Past Medical History:  Diagnosis Date   Hypertension     Patient Active Problem List   Diagnosis Date Noted   Intraligamentous cyst of knee, right 09/17/2016   Quadriceps tendinitis 08/19/2016   Degenerative arthritis of knee, bilateral 08/19/2016   Knee MCL sprain 05/01/2015   Neck pain 05/02/2014   Nonallopathic lesion of cervical region 05/02/2014   Greater trochanteric bursitis of right hip 03/03/2014   Strain of flexor muscle of right hip 02/03/2014   Piriformis syndrome of right side 02/03/2014    History reviewed. No pertinent surgical history.     Home Medications    Prior to Admission medications   Medication Sig Start Date End Date Taking? Authorizing Provider  losartan -hydrochlorothiazide (HYZAAR) 50-12.5 MG tablet Take 1 tablet by mouth daily. 03/12/24  Yes Thaddaeus Granja, Asberry RIGGERS    Family History History reviewed. No pertinent family history.  Social History Social History   Tobacco Use   Smoking status: Former   Smokeless tobacco: Never  Vaping Use   Vaping status: Never Used  Substance Use Topics   Alcohol  use: Not Currently   Drug use: Never     Allergies   Dimercaprol and Yeast   Review of Systems Review of Systems Negative as per HPI  Physical Exam Triage Vital Signs ED Triage Vitals  Encounter Vitals Group     BP 03/12/24 1037 (!) 155/82     Girls Systolic BP Percentile --      Girls Diastolic BP Percentile --      Boys Systolic BP Percentile --      Boys Diastolic BP Percentile --      Pulse Rate 03/12/24 1037 61     Resp 03/12/24 1037 18     Temp 03/12/24 1037 98 F (36.7 C)     Temp Source 03/12/24 1037 Oral     SpO2 03/12/24 1037 94 %     Weight 03/12/24 1036 200 lb (90.7 kg)     Height 03/12/24 1036 6' 2 (1.88 m)     Head Circumference --      Peak Flow --      Pain Score 03/12/24 1036 0     Pain Loc --      Pain Education --      Exclude from Growth Chart --    No data found.  Updated Vital Signs BP (!) 152/76 (BP Location: Right Arm)   Pulse 61   Temp 98 F (36.7 C) (Oral)   Resp 18   Ht 6'  2 (1.88 m)   Wt 200 lb (90.7 kg)   SpO2 94%   BMI 25.68 kg/m   Visual Acuity Right Eye Distance:   Left Eye Distance:   Bilateral Distance:    Right Eye Near:   Left Eye Near:    Bilateral Near:     Physical Exam Vitals and nursing note reviewed.  Constitutional:      General: He is not in acute distress.    Appearance: Normal appearance.  HENT:     Mouth/Throat:     Mouth: Mucous membranes are moist.     Pharynx: Oropharynx is clear.   Eyes:     Extraocular Movements: Extraocular movements intact.     Conjunctiva/sclera: Conjunctivae normal.     Pupils: Pupils are equal, round, and reactive to light.    Cardiovascular:     Rate and Rhythm: Normal rate and regular rhythm.     Pulses: Normal pulses.     Heart sounds: Normal heart sounds.  Pulmonary:     Effort: Pulmonary effort is normal.     Breath sounds: Normal breath sounds.  Abdominal:     Palpations: Abdomen is soft.     Tenderness: There is no abdominal tenderness.    Musculoskeletal:     Cervical back: Normal range of motion.   Skin:    General: Skin is warm and dry.   Neurological:     Mental Status: He is alert and oriented to person, place, and time.      UC Treatments / Results  Labs (all labs ordered are listed, but only abnormal results are displayed) Labs Reviewed - No data to display  EKG   Radiology No results found.  Procedures Procedures (including critical care time)  Medications Ordered in UC Medications - No data to display  Initial Impression / Assessment and Plan / UC Course  I have reviewed the triage vital signs and the nursing notes.  Pertinent labs & imaging results that were available during my care of the patient were reviewed by me and considered in my medical decision making (see chart for details).  BP 152/76. No symptoms.  Following with cardiology recommendations, sent losartan -hydrochlorothiazide 50-12.5 mg to take once daily.  Have sent a 2-week supply which will carry him through to his primary care appointment.  Advise reasons to be seen in clinic or go to the emergency department.  Patient without questions  Final Clinical Impressions(s) / UC Diagnoses   Final diagnoses:  Hypertension, unspecified type  Medication refill     Discharge Instructions      Please take Hyzaar (losartan -hydrochlorothiazide) one tablet once daily, every single day, until your follow up with your primary care provider on July 11th.  This is the medication your cardiologist has prescribed for you.  If you develop any symptoms please return or be evaluated in the emergency department.     ED Prescriptions     Medication Sig Dispense Auth. Provider   losartan -hydrochlorothiazide (HYZAAR) 50-12.5 MG tablet Take 1 tablet by mouth daily. 14 tablet Noha Milberger, [place], PA-C      PDMP not reviewed this encounter.   Jeryl Asberry, [place] 03/12/24 1111

## 2024-03-12 NOTE — Discharge Instructions (Signed)
 Please take Hyzaar (losartan -hydrochlorothiazide) one tablet once daily, every single day, until your follow up with your primary care provider on July 11th.  This is the medication your cardiologist has prescribed for you.  If you develop any symptoms please return or be evaluated in the emergency department.

## 2024-03-12 NOTE — ED Triage Notes (Signed)
 Patient presenting for refill of his Losartan  blood pressure medications. Scheduled to be seen 03/26/2024. Patient ran out of medication 3 days ago.

## 2024-03-26 ENCOUNTER — Ambulatory Visit: Payer: Self-pay | Admitting: Family

## 2024-03-26 ENCOUNTER — Ambulatory Visit (HOSPITAL_COMMUNITY)
Admission: EM | Admit: 2024-03-26 | Discharge: 2024-03-26 | Disposition: A | Discharge status: Home / Self Care | Attending: Physician Assistant | Admitting: Physician Assistant

## 2024-03-26 ENCOUNTER — Encounter (HOSPITAL_COMMUNITY): Payer: Self-pay | Admitting: Emergency Medicine

## 2024-03-26 DIAGNOSIS — I10 Essential (primary) hypertension: Secondary | ICD-10-CM

## 2024-03-26 MED ORDER — LOSARTAN POTASSIUM-HCTZ 50-12.5 MG PO TABS
1.0000 | ORAL_TABLET | Freq: Every day | ORAL | 0 refills | Status: DC
Start: 2024-03-26 — End: 2024-04-07

## 2024-03-26 NOTE — ED Provider Notes (Signed)
 [facility]    CSN: [id number] Arrival date & time: 03/26/24  1017      History   Chief Complaint Chief Complaint  Patient presents with   Medication Refill    HPI Anthony Burns is a 78 y.o. male.   Patient here today for refill of blood pressure medication.  He reports he has not taken today and blood pressure is somewhat higher.  He denies any chest pain or shortness of breath.  He has not any headaches, numbness or tingling.  He states he has not had any issues with medication.  He does have appointment with primary care in 3 days.  The history is provided by the patient.  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 No pertinent surgical history.     Home Medications    Prior to Admission medications   Medication Sig Start Date End Date Taking? Authorizing Provider  losartan -hydrochlorothiazide (HYZAAR) 50-12.5 MG tablet Take 1 tablet by mouth daily. 03/26/24   Billy Asberry FALCON, PA-C    Family History No family history on file.  Social History Social History   Tobacco Use   Smoking status: Former   Smokeless tobacco: Never  Advertising account planner   Vaping status: Never Used  Substance Use Topics   Alcohol use: Not Currently   Drug use: Never     Allergies   Dimercaprol and Yeast   Review of Systems Review of Systems  Constitutional:  Negative for chills and fever.  Eyes:  Negative for discharge and  redness.  Respiratory:  Negative for cough and shortness of breath.   Cardiovascular:  Negative for chest pain and leg swelling.  Neurological:  Negative for numbness and headaches.     Physical Exam Triage Vital Signs ED Triage Vitals  Encounter Vitals Group     BP 03/26/24 1107 (!) 166/93     Girls Systolic BP Percentile --      Girls Diastolic BP Percentile --      Boys Systolic BP Percentile --      Boys Diastolic BP Percentile --      Pulse Rate 03/26/24 1107 60     Resp 03/26/24 1107 17     Temp 03/26/24 1107 (!) 97.4 F (36.3 C)     Temp Source 03/26/24 1107 Oral     SpO2 03/26/24 1107 95 %     Weight --      Height --      Head Circumference --      Peak Flow --      Pain Score 03/26/24 1106 5     Pain Loc --      Pain Education --      Exclude from Growth Chart --    No data found.  Updated Vital Signs BP (!) 166/93 (BP Location: Left Arm)   Pulse 60   Temp (!) 97.4 F (36.3 C) (Oral)   Resp 17  SpO2 95%   Visual Acuity Right Eye Distance:   Left Eye Distance:   Bilateral Distance:    Right Eye Near:   Left Eye Near:    Bilateral Near:     Physical Exam Vitals and nursing note reviewed.  Constitutional:      General: He is not in acute distress.    Appearance: Normal appearance. He is not ill-appearing.  HENT:     Head: Normocephalic and atraumatic.  Eyes:     Conjunctiva/sclera: Conjunctivae normal.  Cardiovascular:     Rate and Rhythm: Normal rate and regular rhythm.  Pulmonary:     Effort: Pulmonary effort is normal. No respiratory distress.     Breath sounds: No wheezing, rhonchi or rales.  Musculoskeletal:     Right lower leg: No edema.     Left lower leg: No edema.  Neurological:     Mental Status: He is alert.  Psychiatric:        Mood and Affect: Mood normal.        Behavior: Behavior normal.        Thought Content: Thought content normal.      UC Treatments / Results  Labs (all labs ordered are listed, but only abnormal  results are displayed) Labs Reviewed - No data to display  EKG   Radiology No results found.  Procedures Procedures (including critical care time)  Medications Ordered in UC Medications - No data to display  Initial Impression / Assessment and Plan / UC Course  I have reviewed the triage vital signs and the nursing notes.  Pertinent labs & imaging results that were available during my care of the patient were reviewed by me and considered in my medical decision making (see chart for details).    Blood pressure medication refilled as previously prescribed.  Advised to keep follow-up with primary care next week.  Final Clinical Impressions(s) / UC Diagnoses   Final diagnoses:  Essential hypertension   Discharge Instructions   None    ED Prescriptions     Medication Sig Dispense Auth. Provider   losartan -hydrochlorothiazide (HYZAAR) 50-12.5 MG tablet Take 1 tablet by mouth daily. 14 tablet Billy Asberry FALCON, PA-C      PDMP not reviewed this encounter.   Billy Asberry FALCON, PA-C 03/26/24 [phone]

## 2024-03-26 NOTE — ED Triage Notes (Signed)
 Pt reports that had new patient appt with PCP that was supposed to be today at 1020am. Reports that appt not today and is next week. So couldn't get his HTN medication refilled today.

## 2024-03-26 NOTE — Progress Notes (Unsigned)
 Location:   [facility]   Place of Service:    Provider: Larwance Hark NP  Code Status: DNR Goals of Care:     03/29/2024    8:29 AM  Advanced Directives  Does Patient Have a Medical Advance Directive? No  Would patient like information on creating a medical advance directive? No - Patient declined     Chief Complaint  Patient presents with   Establish Care    New Patient Appointment    HPI: Patient is a 78 y.o. male seen today for an acute visit for Discussed the use of AI scribe software for clinical note transcription with the patient, who gave verbal consent to proceed.  History of Present Illness  Anthony Burns is a 78 year old male with chronic lower back pain who presents with worsening symptoms.  He has been experiencing chronic lower back pain primarily on the left side, which sometimes radiates as a dull ache. The pain has been present since around 2021, with significant exacerbation over the past four to five years. It is persistent, even when lying in bed, unless he finds a specific position that alleviates it. The pain is described as a dull ache.  Current management includes Advil taken before sleep and the application of CBD oil to his lower back, which provides enough relief to fall asleep. He has not tried muscle relaxants or other prescribed medications for this condition and is aware of potential side effects such as blurred vision and dizziness   He notes that his blood pressure was normal before the latest episode of back pain. HTN, taking Losartan /hydrochlorothiazide, Bun/creat 24/0.92 10/27/23     Past Medical History:  Diagnosis Date   Hypertension     Past Surgical History:  Procedure Laterality Date   TONSILLECTOMY      Allergies  Allergen Reactions   Dimercaprol Itching    Eye itching   Yeast Hives    Allergies as of 03/29/2024       Reactions   Dimercaprol Itching   Eye itching   Yeast Hives        Medication List         Accurate as of March 29, 2024  9:09 AM. If you have any questions, ask your nurse or doctor.          cyclobenzaprine  5 MG tablet Commonly known as: FLEXERIL  Take 0.5 tablets (2.5 mg total) by mouth 3 (three) times daily as needed for muscle spasms. Started by: Schawn Byas X Meher Kucinski   diazepam 10 MG tablet Commonly known as: VALIUM Take 10 mg by mouth every 12 (twelve) hours as needed.   losartan -hydrochlorothiazide 50-12.5 MG tablet Commonly known as: Hyzaar Take 1 tablet by mouth daily.        Review of Systems:  Review of Systems  Constitutional:  Negative for appetite change, fatigue and fever.  HENT:  Negative for congestion, hearing loss and trouble swallowing.   Eyes:  Positive for visual disturbance.       Blurred vision for a long time  Respiratory:  Negative for cough and wheezing.   Cardiovascular:  Negative for palpitations and leg swelling.  Gastrointestinal:  Negative for abdominal pain and constipation.  Genitourinary:  Negative for frequency and urgency.  Musculoskeletal:  Positive for arthralgias and back pain. Negative for gait problem.  Skin:  Negative for color change.  Neurological:  Negative for dizziness, speech difficulty, weakness and headaches.  Psychiatric/Behavioral:  Positive for sleep disturbance. Negative for confusion.  The patient is not nervous/anxious.        Only when back pain is not well controlled.     Health Maintenance  Topic Date Due   Hepatitis C Screening  Never done   DTaP/Tdap/Td (1 - Tdap) Never done   Pneumococcal Vaccine: 50+ Years (1 of 1 - PCV) Never done   Zoster Vaccines- Shingrix (1 of 2) Never done   COVID-19 Vaccine (1 - 2024-25 season) Never done   INFLUENZA VACCINE  04/16/2024   Hepatitis B Vaccines  Aged Out   HPV VACCINES  Aged Out   Meningococcal B Vaccine  Aged Out    Physical Exam: Vitals:   03/29/24 0832  BP: 138/86  Pulse: 78  Resp: 20  Temp: 97.6 F (36.4 C)  SpO2: 98%  Weight: 205 lb 6.4 oz (93.2 kg)   Height: 6' (1.829 m)   Body mass index is 27.86 kg/m. Physical Exam Vitals and nursing note reviewed.  Constitutional:      Appearance: Normal appearance.  HENT:     Head: Normocephalic and atraumatic.     Nose: Nose normal.     Mouth/Throat:     Mouth: Mucous membranes are moist.  Eyes:     Extraocular Movements: Extraocular movements intact.     Conjunctiva/sclera: Conjunctivae normal.     Pupils: Pupils are equal, round, and reactive to light.  Cardiovascular:     Rate and Rhythm: Normal rate and regular rhythm.     Heart sounds: No murmur heard. Pulmonary:     Effort: Pulmonary effort is normal.     Breath sounds: No wheezing, rhonchi or rales.  Abdominal:     General: Bowel sounds are normal.     Palpations: Abdomen is soft.     Tenderness: There is no abdominal tenderness.  Musculoskeletal:        General: Tenderness present. Normal range of motion.     Cervical back: Normal range of motion and neck supple.     Right lower leg: No edema.     Left lower leg: No edema.     Comments: Lower back with the left sciatica, no spinal process tenderness palpated.   Skin:    General: Skin is warm and dry.     Findings: No rash.  Neurological:     General: No focal deficit present.     Mental Status: He is alert and oriented to person, place, and time. Mental status is at baseline.     Motor: No weakness.     Coordination: Coordination normal.     Gait: Gait normal.  Psychiatric:        Mood and Affect: Mood normal.        Behavior: Behavior normal.        Thought Content: Thought content normal.        Judgment: Judgment normal.     Labs reviewed: Basic Metabolic Panel: Recent Labs    10/27/23 1557  NA 139  K 4.0  CL 104  CO2 25  GLUCOSE 110*  BUN 24*  CREATININE 0.92  CALCIUM 9.6   Liver Function Tests: Recent Labs    10/27/23 1557  AST 25  ALT 37  ALKPHOS 51  BILITOT 0.5  PROT 7.0  ALBUMIN 3.7   No results for input(s): LIPASE, AMYLASE in  the last 8760 hours. No results for input(s): AMMONIA in the last 8760 hours. CBC: Recent Labs    10/27/23 1557  WBC 8.5  NEUTROABS 5.8  HGB 13.8  HCT 41.8  MCV 91.9  PLT 295   Lipid Panel: No results for input(s): CHOL, HDL, LDLCALC, TRIG, CHOLHDL, LDLDIRECT in the last 8760 hours. No results found for: HGBA1C  Procedures since last visit: No results found.  Assessment/Plan Assessment & Plan  Chronic lower back pain Chronic dull ache, intermittent, radiating to legs. Managed with Advil and CBD oil.  - Prescribed low-dose Flexeril  (2.5 mg) to assess effectiveness and tolerance. - Discussed potential side effects including blurred vision and dizziness.  Pending spinal fusion surgery       Labs/tests ordered: desires to delay after decision on spinal fusion surgery Next appt:  6 months.

## 2024-03-29 ENCOUNTER — Encounter: Payer: Self-pay | Admitting: Nurse Practitioner

## 2024-03-29 ENCOUNTER — Ambulatory Visit (INDEPENDENT_AMBULATORY_CARE_PROVIDER_SITE_OTHER): Admitting: Nurse Practitioner

## 2024-03-29 VITALS — BP 138/86 | HR 78 | Temp 97.6°F | Resp 20 | Ht 72.0 in | Wt 205.4 lb

## 2024-03-29 DIAGNOSIS — G8929 Other chronic pain: Secondary | ICD-10-CM

## 2024-03-29 DIAGNOSIS — M5442 Lumbago with sciatica, left side: Secondary | ICD-10-CM

## 2024-03-29 DIAGNOSIS — M545 Low back pain, unspecified: Secondary | ICD-10-CM | POA: Insufficient documentation

## 2024-03-29 MED ORDER — CYCLOBENZAPRINE HCL 5 MG PO TABS
2.5000 mg | ORAL_TABLET | Freq: Three times a day (TID) | ORAL | 1 refills | Status: DC | PRN
Start: 2024-03-29 — End: 2024-05-04

## 2024-03-29 NOTE — Assessment & Plan Note (Signed)
 Chronic dull ache, intermittent, radiating to legs. Managed with Advil and CBD oil.  - Prescribed low-dose Flexeril  (2.5 mg) to assess effectiveness and tolerance. - Discussed potential side effects including blurred vision and dizziness.  Pending spinal fusion surgery

## 2024-04-07 ENCOUNTER — Other Ambulatory Visit: Payer: Self-pay

## 2024-04-07 MED ORDER — LOSARTAN POTASSIUM-HCTZ 50-12.5 MG PO TABS: 1.0000 | ORAL_TABLET | Freq: Every day | ORAL | 1 refills | Status: DC

## 2024-05-04 ENCOUNTER — Other Ambulatory Visit: Payer: Self-pay

## 2024-05-04 MED ORDER — CYCLOBENZAPRINE HCL 5 MG PO TABS: 2.5000 mg | ORAL_TABLET | Freq: Three times a day (TID) | ORAL | 1 refills | Status: DC | PRN

## 2024-05-04 NOTE — Telephone Encounter (Signed)
 Patient came into office and stated he needs refill on medication Cyclobenzaprine . Last refilled 03/29/2024 with 15 tablets and 1 refill. Patient states that he's been taking medication differently than prescribed. He stated that 1/2 tablet didn't work well. Instead he's been taking 1 tablet daily. Message and medication pend and sent to provider for approval. Please change instructions if you feel its necessary.

## 2024-05-07 NOTE — Telephone Encounter (Signed)
 Late reply. Medication refilled by PCP Ngetich, Roxan BROCKS, NP

## 2024-06-11 ENCOUNTER — Other Ambulatory Visit: Payer: Self-pay | Admitting: *Deleted

## 2024-06-11 MED ORDER — CYCLOBENZAPRINE HCL 5 MG PO TABS: 2.5000 mg | ORAL_TABLET | Freq: Three times a day (TID) | ORAL | 1 refills | Status: DC | PRN

## 2024-06-11 NOTE — Telephone Encounter (Signed)
 Patient walked into office with Rx bottle requesting refill for Cyclobenzaprine   Pended Rx and sent to [facility] for approval.

## 2024-08-16 ENCOUNTER — Telehealth: Payer: Self-pay | Admitting: Family

## 2024-08-16 NOTE — Telephone Encounter (Unsigned)
 Copied from CRM #[id number]. Topic: Clinical - Medication Refill >> Aug 16, 2024  1:43 PM DeAngela L wrote: Medication: cyclobenzaprine  (FLEXERIL ) 5 MG tablet  Has the patient contacted their pharmacy? Yes  (Agent: If no, request that the patient contact the pharmacy for the refill. If patient does not wish to contact the pharmacy document the reason why and proceed with request.) (Agent: If yes, when and what did the pharmacy advise?)  This is the patient's preferred pharmacy:  [id number] [place], [place] - [address] AT [facility] [address] [place] [address] Phone: [phone] Fax: [phone]  Is this the correct pharmacy for this prescription? Yes  If no, delete pharmacy and type the correct one.   Has the prescription been filled recently? Yes   Is the patient out of the medication? Yes   Has the patient been seen for an appointment in the last year OR does the patient have an upcoming appointment? Yes   Can we respond through MyChart? No   Agent: Please be advised that Rx refills may take up to 3 business days. We ask that you follow-up with your pharmacy.

## 2024-08-18 MED ORDER — CYCLOBENZAPRINE HCL 5 MG PO TABS: 2.5000 mg | ORAL_TABLET | Freq: Three times a day (TID) | ORAL | 1 refills | Status: DC | PRN

## 2024-09-17 ENCOUNTER — Telehealth: Payer: Self-pay

## 2024-09-17 MED ORDER — CYCLOBENZAPRINE HCL 5 MG PO TABS: 2.5000 mg | ORAL_TABLET | Freq: Three times a day (TID) | ORAL | 1 refills | Status: DC | PRN

## 2024-09-17 NOTE — Telephone Encounter (Signed)
 Patient in the office by filling out paperwork to ask Ngetich, Dinah C, NP if we can refill medication for cyclobenzaprine  and to sent to the pharmacy.

## 2024-09-29 ENCOUNTER — Encounter: Payer: Self-pay | Admitting: Family

## 2024-09-29 NOTE — Progress Notes (Signed)
   This encounter was created in error - please disregard. No show

## 2024-10-05 ENCOUNTER — Ambulatory Visit: Admitting: Family

## 2024-10-05 ENCOUNTER — Encounter: Payer: Self-pay | Admitting: Family

## 2024-10-05 VITALS — BP 98/54 | HR 78 | Temp 97.9°F | Resp 97 | Ht 72.0 in | Wt 203.2 lb

## 2024-10-05 DIAGNOSIS — R413 Other amnesia: Secondary | ICD-10-CM | POA: Diagnosis not present

## 2024-10-05 DIAGNOSIS — G8929 Other chronic pain: Secondary | ICD-10-CM | POA: Diagnosis not present

## 2024-10-05 DIAGNOSIS — M5442 Lumbago with sciatica, left side: Secondary | ICD-10-CM

## 2024-10-05 DIAGNOSIS — I10 Essential (primary) hypertension: Secondary | ICD-10-CM

## 2024-10-05 MED ORDER — CYCLOBENZAPRINE HCL 5 MG PO TABS: 2.5000 mg | ORAL_TABLET | Freq: Three times a day (TID) | ORAL | 1 refills | Status: AC | PRN

## 2024-10-05 MED ORDER — LOSARTAN POTASSIUM-HCTZ 50-12.5 MG PO TABS: 0.5000 | ORAL_TABLET | Freq: Every day | ORAL | 1 refills | Status: AC

## 2024-10-05 NOTE — Patient Instructions (Addendum)
 Due for shingles, tetanus, and Pneumonia vaccines. Can obtain at local pharmacy.  Take half tablet of losartan  -Hydrochlorothiazide due to low blood pressure  3. check Blood pressure at home and record on log provided and notify provider if B/p > 140/90 or lower than 100/60
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
## 2024-10-10 ENCOUNTER — Other Ambulatory Visit: Payer: Self-pay | Admitting: Family

## 2024-10-10 DIAGNOSIS — I10 Essential (primary) hypertension: Secondary | ICD-10-CM

## 2024-10-15 LAB — CBC WITH DIFFERENTIAL/PLATELET
Absolute Lymphocytes: 948 {cells}/uL (ref 850–3900)
Absolute Monocytes: 800 {cells}/uL (ref 200–950)
Basophils Absolute: 52 {cells}/uL (ref 0–200)
Basophils Relative: 0.6 %
Eosinophils Absolute: 244 {cells}/uL (ref 15–500)
Eosinophils Relative: 2.8 %
HCT: 42.8 % (ref 39.4–51.1)
Hemoglobin: 14.7 g/dL (ref 13.2–17.1)
MCH: 30.4 pg (ref 27.0–33.0)
MCHC: 34.3 g/dL (ref 31.6–35.4)
MCV: 88.6 fL (ref 81.4–101.7)
MPV: 10.5 fL (ref 7.5–12.5)
Monocytes Relative: 9.2 %
Neutro Abs: 6656 {cells}/uL (ref 1500–7800)
Neutrophils Relative %: 76.5 %
Platelets: 313 10*3/uL (ref 140–400)
RBC: 4.83 Million/uL (ref 4.20–5.80)
RDW: 12.5 % (ref 11.0–15.0)
Total Lymphocyte: 10.9 %
WBC: 8.7 10*3/uL (ref 3.8–10.8)

## 2024-10-15 LAB — LIPID PANEL
Cholesterol: 201 mg/dL — ABNORMAL HIGH
HDL: 51 mg/dL
LDL Cholesterol (Calc): 117 mg/dL — ABNORMAL HIGH
Non-HDL Cholesterol (Calc): 150 mg/dL — ABNORMAL HIGH
Total CHOL/HDL Ratio: 3.9 (calc)
Triglycerides: 210 mg/dL — ABNORMAL HIGH

## 2024-10-15 LAB — AD-DETECT ABETA 42/40 AND P-TAU217 EVALUATION, PLASMA
ABETA 40: 428 pg/mL
ABETA 42/40 RATIO: 0.203
ABETA 42: 87 pg/mL
AD DETECT PTAU217: 0.22 pg/mL
LIKELIHOOD SCORE: 0.0255

## 2024-10-15 LAB — COMPREHENSIVE METABOLIC PANEL WITH GFR
AG Ratio: 1.5 (calc) (ref 1.0–2.5)
ALT: 22 U/L (ref 9–46)
AST: 17 U/L (ref 10–35)
Albumin: 4.4 g/dL (ref 3.6–5.1)
Alkaline phosphatase (APISO): 82 U/L (ref 35–144)
BUN/Creatinine Ratio: 19 (calc) (ref 6–22)
BUN: 31 mg/dL — ABNORMAL HIGH (ref 7–25)
CO2: 27 mmol/L (ref 20–32)
Calcium: 10.1 mg/dL (ref 8.6–10.3)
Chloride: 101 mmol/L (ref 98–110)
Creat: 1.65 mg/dL — ABNORMAL HIGH (ref 0.70–1.28)
Globulin: 3 g/dL (ref 1.9–3.7)
Glucose, Bld: 124 mg/dL (ref 65–139)
Potassium: 4 mmol/L (ref 3.5–5.3)
Sodium: 139 mmol/L (ref 135–146)
Total Bilirubin: 0.7 mg/dL (ref 0.2–1.2)
Total Protein: 7.4 g/dL (ref 6.1–8.1)
eGFR: 42 mL/min/{1.73_m2} — ABNORMAL LOW

## 2024-10-15 LAB — TSH: TSH: 2.46 m[IU]/L (ref 0.40–4.50)

## 2024-10-21 ENCOUNTER — Encounter: Payer: Self-pay | Admitting: Family

## 2024-10-21 ENCOUNTER — Ambulatory Visit: Admitting: Family

## 2024-10-21 VITALS — BP 118/70 | HR 83 | Temp 97.0°F | Ht 72.0 in | Wt 197.4 lb

## 2024-10-21 DIAGNOSIS — J209 Acute bronchitis, unspecified: Secondary | ICD-10-CM

## 2024-10-21 DIAGNOSIS — R0982 Postnasal drip: Secondary | ICD-10-CM

## 2024-10-21 DIAGNOSIS — R6889 Other general symptoms and signs: Secondary | ICD-10-CM

## 2024-10-21 DIAGNOSIS — Z1152 Encounter for screening for COVID-19: Secondary | ICD-10-CM

## 2024-10-21 LAB — POCT INFLUENZA A/B
Influenza A, POC: NEGATIVE
Influenza B, POC: NEGATIVE

## 2024-10-21 LAB — POC COVID19 BINAXNOW: SARS Coronavirus 2 Ag: NEGATIVE

## 2024-10-21 MED ORDER — FLUTICASONE PROPIONATE 50 MCG/ACT NA SUSP: 2.0000 | Freq: Every day | NASAL | 6 refills | Status: AC

## 2024-10-21 MED ORDER — DOXYCYCLINE HYCLATE 100 MG PO TABS: 100.0000 mg | ORAL_TABLET | Freq: Two times a day (BID) | ORAL | 0 refills | Status: AC

## 2024-10-21 MED ORDER — PREDNISONE 20 MG PO TABS: 40.0000 mg | ORAL_TABLET | Freq: Every day | ORAL | 0 refills | Status: AC

## 2024-10-22 ENCOUNTER — Ambulatory Visit: Payer: Self-pay | Admitting: Family

## 2025-04-06 ENCOUNTER — Ambulatory Visit: Admitting: Family
